# Patient Record
Sex: Female | Born: 1935
Health system: Southern US, Community
[De-identification: ages and names within clinical notes are randomized; demographics above are authoritative.]

## PROBLEM LIST (undated history)

## (undated) DIAGNOSIS — H9190 Unspecified hearing loss, unspecified ear: Secondary | ICD-10-CM

## (undated) DIAGNOSIS — C50919 Malignant neoplasm of unspecified site of unspecified female breast: Secondary | ICD-10-CM

## (undated) DIAGNOSIS — F32A Depression, unspecified: Secondary | ICD-10-CM

## (undated) DIAGNOSIS — E785 Hyperlipidemia, unspecified: Secondary | ICD-10-CM

## (undated) DIAGNOSIS — F419 Anxiety disorder, unspecified: Secondary | ICD-10-CM

## (undated) DIAGNOSIS — Z9221 Personal history of antineoplastic chemotherapy: Secondary | ICD-10-CM

## (undated) DIAGNOSIS — M1990 Unspecified osteoarthritis, unspecified site: Secondary | ICD-10-CM

## (undated) DIAGNOSIS — F329 Major depressive disorder, single episode, unspecified: Secondary | ICD-10-CM

## (undated) DIAGNOSIS — Z923 Personal history of irradiation: Secondary | ICD-10-CM

## (undated) DIAGNOSIS — E119 Type 2 diabetes mellitus without complications: Secondary | ICD-10-CM

## (undated) DIAGNOSIS — G510 Bell's palsy: Secondary | ICD-10-CM

## (undated) DIAGNOSIS — I10 Essential (primary) hypertension: Secondary | ICD-10-CM

## (undated) DIAGNOSIS — K219 Gastro-esophageal reflux disease without esophagitis: Secondary | ICD-10-CM

## (undated) DIAGNOSIS — Z972 Presence of dental prosthetic device (complete) (partial): Secondary | ICD-10-CM

## (undated) HISTORY — DX: Essential (primary) hypertension: I10

## (undated) HISTORY — DX: Unspecified hearing loss, unspecified ear: H91.90

## (undated) HISTORY — DX: Hyperlipidemia, unspecified: E78.5

## (undated) HISTORY — DX: Bell's palsy: G51.0

## (undated) HISTORY — DX: Unspecified osteoarthritis, unspecified site: M19.90

## (undated) HISTORY — PX: JOINT REPLACEMENT: SHX530

---

## 2005-05-18 ENCOUNTER — Ambulatory Visit: Payer: Self-pay | Admitting: Internal Medicine

## 2005-05-20 ENCOUNTER — Ambulatory Visit: Payer: Self-pay | Admitting: Internal Medicine

## 2006-06-08 ENCOUNTER — Ambulatory Visit: Payer: Self-pay | Admitting: Internal Medicine

## 2006-09-01 ENCOUNTER — Ambulatory Visit: Payer: Self-pay | Admitting: Gastroenterology

## 2006-12-11 ENCOUNTER — Ambulatory Visit: Payer: Self-pay | Admitting: Internal Medicine

## 2007-12-12 ENCOUNTER — Ambulatory Visit: Payer: Self-pay | Admitting: Family Medicine

## 2008-12-25 ENCOUNTER — Ambulatory Visit: Payer: Self-pay | Admitting: Internal Medicine

## 2009-10-05 ENCOUNTER — Ambulatory Visit: Payer: Self-pay | Admitting: Orthopedic Surgery

## 2009-10-14 HISTORY — PX: REPLACEMENT TOTAL KNEE: SUR1224

## 2009-10-19 ENCOUNTER — Inpatient Hospital Stay: Payer: Self-pay | Admitting: Orthopedic Surgery

## 2010-01-08 ENCOUNTER — Ambulatory Visit: Payer: Self-pay | Admitting: Internal Medicine

## 2010-11-14 DIAGNOSIS — C50919 Malignant neoplasm of unspecified site of unspecified female breast: Secondary | ICD-10-CM

## 2010-11-14 HISTORY — PX: BREAST LUMPECTOMY: SHX2

## 2010-11-14 HISTORY — PX: BREAST EXCISIONAL BIOPSY: SUR124

## 2010-11-14 HISTORY — DX: Malignant neoplasm of unspecified site of unspecified female breast: C50.919

## 2011-01-11 ENCOUNTER — Ambulatory Visit: Payer: Self-pay | Admitting: Internal Medicine

## 2011-01-14 ENCOUNTER — Ambulatory Visit: Payer: Self-pay | Admitting: Internal Medicine

## 2011-02-08 ENCOUNTER — Ambulatory Visit: Payer: Self-pay | Admitting: Surgery

## 2011-02-13 ENCOUNTER — Ambulatory Visit: Payer: Self-pay | Admitting: Internal Medicine

## 2011-02-13 DIAGNOSIS — Z853 Personal history of malignant neoplasm of breast: Secondary | ICD-10-CM | POA: Insufficient documentation

## 2011-02-15 LAB — PATHOLOGY REPORT

## 2011-02-17 ENCOUNTER — Ambulatory Visit: Payer: Self-pay | Admitting: Surgery

## 2011-02-22 ENCOUNTER — Ambulatory Visit: Payer: Self-pay | Admitting: Surgery

## 2011-02-23 ENCOUNTER — Inpatient Hospital Stay: Payer: Self-pay | Admitting: Surgery

## 2011-02-25 LAB — PATHOLOGY REPORT

## 2011-03-01 ENCOUNTER — Ambulatory Visit: Payer: Self-pay | Admitting: Internal Medicine

## 2011-03-15 ENCOUNTER — Ambulatory Visit: Payer: Self-pay | Admitting: Internal Medicine

## 2011-03-28 ENCOUNTER — Ambulatory Visit: Payer: Self-pay | Admitting: Surgery

## 2011-04-15 ENCOUNTER — Ambulatory Visit: Payer: Self-pay | Admitting: Internal Medicine

## 2011-05-15 ENCOUNTER — Ambulatory Visit: Payer: Self-pay | Admitting: Internal Medicine

## 2011-06-15 ENCOUNTER — Ambulatory Visit: Payer: Self-pay | Admitting: Internal Medicine

## 2011-07-16 ENCOUNTER — Ambulatory Visit: Payer: Self-pay | Admitting: Internal Medicine

## 2011-08-15 ENCOUNTER — Ambulatory Visit: Payer: Self-pay | Admitting: Internal Medicine

## 2011-09-15 ENCOUNTER — Ambulatory Visit: Payer: Self-pay | Admitting: Internal Medicine

## 2011-10-15 ENCOUNTER — Ambulatory Visit: Payer: Self-pay | Admitting: Internal Medicine

## 2011-11-15 ENCOUNTER — Ambulatory Visit: Payer: Self-pay | Admitting: Internal Medicine

## 2011-11-15 HISTORY — PX: BREAST BIOPSY: SHX20

## 2011-11-17 LAB — HEPATIC FUNCTION PANEL A (ARMC)
Albumin: 3.5 g/dL (ref 3.4–5.0)
Bilirubin,Total: 0.3 mg/dL (ref 0.2–1.0)
SGOT(AST): 14 U/L — ABNORMAL LOW (ref 15–37)
SGPT (ALT): 15 U/L
Total Protein: 7.3 g/dL (ref 6.4–8.2)

## 2011-11-17 LAB — CBC CANCER CENTER
Basophil #: 0 x10 3/mm (ref 0.0–0.1)
Eosinophil %: 0.6 %
Lymphocyte #: 1.9 x10 3/mm (ref 1.0–3.6)
Lymphocyte %: 23.2 %
MCV: 91.4 fL (ref 80–100)
Monocyte #: 0.6 x10 3/mm (ref 0.0–0.7)
Monocyte %: 7.2 %
Neutrophil %: 68.7 %
Platelet: 252 x10 3/mm (ref 150–440)
RBC: 3.99 10*6/uL (ref 3.80–5.20)
RDW: 13 % (ref 11.5–14.5)

## 2011-11-17 LAB — BASIC METABOLIC PANEL
Anion Gap: 8 (ref 7–16)
BUN: 15 mg/dL (ref 7–18)
Co2: 30 mmol/L (ref 21–32)
EGFR (African American): >60
EGFR (Non-African Amer.): 58 — ABNORMAL LOW
Glucose: 99 mg/dL (ref 65–99)
Osmolality: 271 (ref 275–301)
Potassium: 3.8 mmol/L (ref 3.5–5.1)

## 2011-11-17 LAB — MAGNESIUM: Magnesium: 1.6 mg/dL — ABNORMAL LOW

## 2011-12-16 ENCOUNTER — Ambulatory Visit: Payer: Self-pay | Admitting: Internal Medicine

## 2012-01-13 ENCOUNTER — Ambulatory Visit: Payer: Self-pay | Admitting: Internal Medicine

## 2012-01-19 LAB — BASIC METABOLIC PANEL
Anion Gap: 6 — ABNORMAL LOW (ref 7–16)
BUN: 13 mg/dL (ref 7–18)
Calcium, Total: 9.2 mg/dL (ref 8.5–10.1)
Co2: 31 mmol/L (ref 21–32)
Creatinine: 1.03 mg/dL (ref 0.60–1.30)
EGFR (African American): >60
EGFR (Non-African Amer.): 56 — ABNORMAL LOW
Osmolality: 268 (ref 275–301)
Sodium: 133 mmol/L — ABNORMAL LOW (ref 136–145)

## 2012-01-19 LAB — CBC CANCER CENTER
Basophil %: 0.5 %
Eosinophil #: 0.1 x10 3/mm (ref 0.0–0.7)
Eosinophil %: 0.7 %
Lymphocyte #: 2 x10 3/mm (ref 1.0–3.6)
Lymphocyte %: 26.6 %
MCH: 30.9 pg (ref 26.0–34.0)
MCHC: 34.5 g/dL (ref 32.0–36.0)
MCV: 89.5 fL (ref 80–100)
Monocyte #: 0.6 x10 3/mm (ref 0.0–0.7)
Neutrophil %: 63.9 %
Platelet: 255 x10 3/mm (ref 150–440)
RBC: 3.67 10*6/uL — ABNORMAL LOW (ref 3.80–5.20)
RDW: 14.5 % (ref 11.5–14.5)
WBC: 7.6 x10 3/mm (ref 3.6–11.0)

## 2012-01-19 LAB — HEPATIC FUNCTION PANEL A (ARMC)
Albumin: 3.3 g/dL — ABNORMAL LOW (ref 3.4–5.0)
Alkaline Phosphatase: 134 U/L (ref 50–136)
Bilirubin, Direct: 0.1 mg/dL (ref 0.00–0.20)
SGOT(AST): 13 U/L — ABNORMAL LOW (ref 15–37)
SGPT (ALT): 16 U/L

## 2012-02-13 ENCOUNTER — Ambulatory Visit: Payer: Self-pay | Admitting: Surgery

## 2012-02-13 ENCOUNTER — Ambulatory Visit: Payer: Self-pay | Admitting: Internal Medicine

## 2012-03-14 ENCOUNTER — Ambulatory Visit: Payer: Self-pay | Admitting: Internal Medicine

## 2012-03-22 LAB — CBC CANCER CENTER
Basophil #: 0.1 x10 3/mm (ref 0.0–0.1)
Basophil %: 0.7 %
Eosinophil #: 0.1 x10 3/mm (ref 0.0–0.7)
Eosinophil %: 1.1 %
HCT: 36.6 % (ref 35.0–47.0)
HGB: 12.2 g/dL (ref 12.0–16.0)
Lymphocyte #: 2.6 x10 3/mm (ref 1.0–3.6)
Lymphocyte %: 30.5 %
MCH: 29.8 pg (ref 26.0–34.0)
MCHC: 33.4 g/dL (ref 32.0–36.0)
MCV: 89 fL (ref 80–100)
Monocyte #: 0.6 x10 3/mm (ref 0.2–0.9)
Monocyte %: 7.2 %
Neutrophil #: 5.1 x10 3/mm (ref 1.4–6.5)
Neutrophil %: 60.5 %
Platelet: 250 x10 3/mm (ref 150–440)
RBC: 4.11 10*6/uL (ref 3.80–5.20)
RDW: 14.9 % — ABNORMAL HIGH (ref 11.5–14.5)
WBC: 8.5 x10 3/mm (ref 3.6–11.0)

## 2012-03-22 LAB — BASIC METABOLIC PANEL
Anion Gap: 9 (ref 7–16)
BUN: 11 mg/dL (ref 7–18)
Calcium, Total: 9 mg/dL (ref 8.5–10.1)
Chloride: 97 mmol/L — ABNORMAL LOW (ref 98–107)
Co2: 29 mmol/L (ref 21–32)
Creatinine: 0.92 mg/dL (ref 0.60–1.30)
EGFR (Non-African Amer.): >60
Glucose: 97 mg/dL (ref 65–99)
Osmolality: 269 (ref 275–301)
Potassium: 3.7 mmol/L (ref 3.5–5.1)

## 2012-04-14 ENCOUNTER — Ambulatory Visit: Payer: Self-pay | Admitting: Internal Medicine

## 2012-05-14 ENCOUNTER — Ambulatory Visit: Payer: Self-pay | Admitting: Internal Medicine

## 2012-06-14 ENCOUNTER — Ambulatory Visit: Payer: Self-pay | Admitting: Internal Medicine

## 2012-07-10 ENCOUNTER — Emergency Department: Payer: Self-pay | Admitting: Emergency Medicine

## 2012-07-10 LAB — WET PREP, GENITAL

## 2012-07-15 ENCOUNTER — Ambulatory Visit: Payer: Self-pay | Admitting: Internal Medicine

## 2012-07-26 LAB — HEPATIC FUNCTION PANEL A (ARMC)
Albumin: 3.4 g/dL (ref 3.4–5.0)
Alkaline Phosphatase: 145 U/L — ABNORMAL HIGH (ref 50–136)
Bilirubin, Direct: 0.1 mg/dL (ref 0.00–0.20)
Bilirubin,Total: 0.3 mg/dL (ref 0.2–1.0)
SGOT(AST): 16 U/L (ref 15–37)

## 2012-07-26 LAB — CBC CANCER CENTER
Eosinophil #: 0.1 x10 3/mm (ref 0.0–0.7)
Eosinophil %: 1.5 %
HGB: 12.8 g/dL (ref 12.0–16.0)
MCH: 30 pg (ref 26.0–34.0)
MCHC: 33.9 g/dL (ref 32.0–36.0)
Monocyte #: 0.8 x10 3/mm (ref 0.2–0.9)
Monocyte %: 8.2 %
Neutrophil %: 62.9 %
Platelet: 267 x10 3/mm (ref 150–440)
RBC: 4.25 10*6/uL (ref 3.80–5.20)

## 2012-07-26 LAB — CREATININE, SERUM
Creatinine: 0.98 mg/dL (ref 0.60–1.30)
EGFR (African American): >60
EGFR (Non-African Amer.): 57 — ABNORMAL LOW

## 2012-08-14 ENCOUNTER — Ambulatory Visit: Payer: Self-pay

## 2012-08-14 ENCOUNTER — Ambulatory Visit: Payer: Self-pay | Admitting: Internal Medicine

## 2012-08-23 ENCOUNTER — Ambulatory Visit: Payer: Self-pay | Admitting: Surgery

## 2012-09-14 ENCOUNTER — Ambulatory Visit: Payer: Self-pay | Admitting: Internal Medicine

## 2012-11-14 HISTORY — PX: BREAST BIOPSY: SHX20

## 2012-11-29 ENCOUNTER — Ambulatory Visit: Payer: Self-pay | Admitting: Internal Medicine

## 2012-11-29 LAB — HEPATIC FUNCTION PANEL A (ARMC)
Alkaline Phosphatase: 136 U/L (ref 50–136)
Bilirubin, Direct: 0.1 mg/dL (ref 0.00–0.20)
Bilirubin,Total: 0.3 mg/dL (ref 0.2–1.0)
SGOT(AST): 21 U/L (ref 15–37)
Total Protein: 7.3 g/dL (ref 6.4–8.2)

## 2012-11-29 LAB — CBC CANCER CENTER
Basophil #: 0.1 x10 3/mm (ref 0.0–0.1)
Basophil %: 1 %
Eosinophil #: 0.1 x10 3/mm (ref 0.0–0.7)
HCT: 39 % (ref 35.0–47.0)
HGB: 13 g/dL (ref 12.0–16.0)
Lymphocyte #: 2.4 x10 3/mm (ref 1.0–3.6)
MCH: 29.3 pg (ref 26.0–34.0)
MCV: 88 fL (ref 80–100)
Neutrophil #: 4.7 x10 3/mm (ref 1.4–6.5)
Neutrophil %: 59.8 %
WBC: 7.9 x10 3/mm (ref 3.6–11.0)

## 2012-11-29 LAB — CREATININE, SERUM
Creatinine: 0.98 mg/dL (ref 0.60–1.30)
EGFR (African American): >60
EGFR (Non-African Amer.): 57 — ABNORMAL LOW

## 2012-11-30 LAB — CANCER ANTIGEN 27.29: CA 27.29: 24.2 U/mL (ref 0.0–38.6)

## 2012-12-15 ENCOUNTER — Ambulatory Visit: Payer: Self-pay | Admitting: Internal Medicine

## 2013-01-12 ENCOUNTER — Ambulatory Visit: Payer: Self-pay | Admitting: Internal Medicine

## 2013-01-22 ENCOUNTER — Ambulatory Visit: Payer: Self-pay | Admitting: Surgery

## 2013-01-29 ENCOUNTER — Ambulatory Visit: Payer: Self-pay | Admitting: Surgery

## 2013-01-30 ENCOUNTER — Ambulatory Visit: Payer: Self-pay | Admitting: Surgery

## 2013-02-21 ENCOUNTER — Ambulatory Visit: Payer: Self-pay | Admitting: Internal Medicine

## 2013-02-25 ENCOUNTER — Ambulatory Visit: Payer: Self-pay | Admitting: Surgery

## 2013-02-26 LAB — PATHOLOGY REPORT

## 2013-03-14 ENCOUNTER — Ambulatory Visit: Payer: Self-pay | Admitting: Internal Medicine

## 2013-04-14 ENCOUNTER — Ambulatory Visit: Payer: Self-pay | Admitting: Internal Medicine

## 2013-05-16 ENCOUNTER — Ambulatory Visit: Payer: Self-pay | Admitting: Internal Medicine

## 2013-06-14 ENCOUNTER — Ambulatory Visit: Payer: Self-pay | Admitting: Internal Medicine

## 2013-06-27 LAB — HEPATIC FUNCTION PANEL A (ARMC)
Alkaline Phosphatase: 122 U/L (ref 50–136)
Bilirubin, Direct: <0.05 mg/dL (ref 0.00–0.20)
Bilirubin,Total: 0.3 mg/dL (ref 0.2–1.0)
SGOT(AST): 17 U/L (ref 15–37)
SGPT (ALT): 18 U/L (ref 12–78)

## 2013-06-27 LAB — CBC CANCER CENTER
Basophil #: 0 x10 3/mm (ref 0.0–0.1)
Basophil %: 0.4 %
Eosinophil #: 0.1 x10 3/mm (ref 0.0–0.7)
Eosinophil %: 1.3 %
HCT: 37.3 % (ref 35.0–47.0)
HGB: 12.6 g/dL (ref 12.0–16.0)
Lymphocyte #: 1.8 x10 3/mm (ref 1.0–3.6)
Lymphocyte %: 24.2 %
MCH: 29.1 pg (ref 26.0–34.0)
MCHC: 33.7 g/dL (ref 32.0–36.0)
Monocyte %: 8.4 %
Neutrophil #: 5 x10 3/mm (ref 1.4–6.5)
Platelet: 257 x10 3/mm (ref 150–440)
RBC: 4.33 10*6/uL (ref 3.80–5.20)
RDW: 14.3 % (ref 11.5–14.5)

## 2013-06-27 LAB — CREATININE, SERUM
Creatinine: 1.06 mg/dL (ref 0.60–1.30)
EGFR (African American): 59 — ABNORMAL LOW
EGFR (Non-African Amer.): 51 — ABNORMAL LOW

## 2013-06-28 LAB — CANCER ANTIGEN 27.29: CA 27.29: 20.7 U/mL (ref 0.0–38.6)

## 2013-07-15 ENCOUNTER — Ambulatory Visit: Payer: Self-pay | Admitting: Internal Medicine

## 2013-08-29 ENCOUNTER — Ambulatory Visit: Payer: Self-pay | Admitting: Surgery

## 2014-02-22 ENCOUNTER — Emergency Department: Payer: Self-pay | Admitting: Emergency Medicine

## 2014-02-27 ENCOUNTER — Ambulatory Visit: Payer: Self-pay | Admitting: Internal Medicine

## 2014-03-13 LAB — HEPATIC FUNCTION PANEL A (ARMC)
Albumin: 3.5 g/dL (ref 3.4–5.0)
Alkaline Phosphatase: 132 U/L — ABNORMAL HIGH
Bilirubin, Direct: 0.1 mg/dL (ref 0.00–0.20)
Bilirubin,Total: 0.4 mg/dL (ref 0.2–1.0)
SGOT(AST): 18 U/L (ref 15–37)
SGPT (ALT): 18 U/L (ref 12–78)
Total Protein: 8 g/dL (ref 6.4–8.2)

## 2014-03-13 LAB — CBC CANCER CENTER
BASOS ABS: 0.2 x10 3/mm — AB (ref 0.0–0.1)
Basophil %: 1.5 %
EOS PCT: 1 %
Eosinophil #: 0.1 x10 3/mm (ref 0.0–0.7)
HCT: 41.5 % (ref 35.0–47.0)
HGB: 13.4 g/dL (ref 12.0–16.0)
LYMPHS ABS: 3.3 x10 3/mm (ref 1.0–3.6)
LYMPHS PCT: 27.4 %
MCH: 28.3 pg (ref 26.0–34.0)
MCHC: 32.4 g/dL (ref 32.0–36.0)
MCV: 87 fL (ref 80–100)
MONO ABS: 0.8 x10 3/mm (ref 0.2–0.9)
Monocyte %: 6.7 %
Neutrophil #: 7.8 x10 3/mm — ABNORMAL HIGH (ref 1.4–6.5)
Neutrophil %: 63.4 %
PLATELETS: 310 x10 3/mm (ref 150–440)
RBC: 4.75 10*6/uL (ref 3.80–5.20)
RDW: 14.8 % — AB (ref 11.5–14.5)
WBC: 12.2 x10 3/mm — ABNORMAL HIGH (ref 3.6–11.0)

## 2014-03-13 LAB — CREATININE, SERUM
Creatinine: 0.94 mg/dL (ref 0.60–1.30)
EGFR (African American): >60
EGFR (Non-African Amer.): 59 — ABNORMAL LOW

## 2014-03-14 ENCOUNTER — Ambulatory Visit: Payer: Self-pay | Admitting: Internal Medicine

## 2014-03-26 DIAGNOSIS — S0990XS Unspecified injury of head, sequela: Secondary | ICD-10-CM

## 2014-03-26 DIAGNOSIS — E1169 Type 2 diabetes mellitus with other specified complication: Secondary | ICD-10-CM | POA: Insufficient documentation

## 2014-03-26 DIAGNOSIS — M8580 Other specified disorders of bone density and structure, unspecified site: Secondary | ICD-10-CM | POA: Insufficient documentation

## 2014-03-26 DIAGNOSIS — E559 Vitamin D deficiency, unspecified: Secondary | ICD-10-CM | POA: Insufficient documentation

## 2014-03-26 DIAGNOSIS — H9191 Unspecified hearing loss, right ear: Secondary | ICD-10-CM | POA: Insufficient documentation

## 2014-03-26 DIAGNOSIS — E785 Hyperlipidemia, unspecified: Secondary | ICD-10-CM | POA: Insufficient documentation

## 2014-03-28 LAB — CBC CANCER CENTER
Basophil #: 0.1 "x10 3/mm "
Basophil %: 1.4 %
Eosinophil #: 0.2 "x10 3/mm "
Eosinophil %: 2.5 %
HCT: 41.1 %
HGB: 13.4 g/dL
Lymphocyte %: 32.3 %
Lymphs Abs: 2.8 "x10 3/mm "
MCH: 28.4 pg
MCHC: 32.8 g/dL
MCV: 87 fL
Monocyte #: 0.7 "x10 3/mm "
Monocyte %: 8.1 %
Neutrophil #: 4.8 "x10 3/mm "
Neutrophil %: 55.7 %
Platelet: 272 "x10 3/mm "
RBC: 4.73 "x10 6/mm "
RDW: 15 % — ABNORMAL HIGH
WBC: 8.5 "x10 3/mm "

## 2014-03-28 LAB — ALKALINE PHOSPHATASE: Alkaline Phosphatase: 123 U/L — ABNORMAL HIGH

## 2014-04-14 ENCOUNTER — Ambulatory Visit: Payer: Self-pay | Admitting: Internal Medicine

## 2014-07-03 DIAGNOSIS — H9191 Unspecified hearing loss, right ear: Secondary | ICD-10-CM | POA: Insufficient documentation

## 2014-07-03 DIAGNOSIS — G510 Bell's palsy: Secondary | ICD-10-CM | POA: Insufficient documentation

## 2014-07-05 DIAGNOSIS — I10 Essential (primary) hypertension: Secondary | ICD-10-CM | POA: Insufficient documentation

## 2014-07-05 DIAGNOSIS — E1122 Type 2 diabetes mellitus with diabetic chronic kidney disease: Secondary | ICD-10-CM | POA: Insufficient documentation

## 2014-07-05 DIAGNOSIS — E1159 Type 2 diabetes mellitus with other circulatory complications: Secondary | ICD-10-CM | POA: Insufficient documentation

## 2014-07-05 DIAGNOSIS — E118 Type 2 diabetes mellitus with unspecified complications: Secondary | ICD-10-CM | POA: Insufficient documentation

## 2014-10-22 DIAGNOSIS — Z23 Encounter for immunization: Secondary | ICD-10-CM | POA: Insufficient documentation

## 2015-02-10 DIAGNOSIS — L280 Lichen simplex chronicus: Secondary | ICD-10-CM | POA: Insufficient documentation

## 2015-04-13 ENCOUNTER — Other Ambulatory Visit: Payer: Self-pay | Admitting: *Deleted

## 2015-04-13 DIAGNOSIS — C50912 Malignant neoplasm of unspecified site of left female breast: Secondary | ICD-10-CM

## 2015-04-16 ENCOUNTER — Inpatient Hospital Stay: Payer: Medicare Other | Attending: Internal Medicine

## 2015-04-16 ENCOUNTER — Inpatient Hospital Stay (HOSPITAL_BASED_OUTPATIENT_CLINIC_OR_DEPARTMENT_OTHER): Payer: Medicare Other | Admitting: Internal Medicine

## 2015-04-16 ENCOUNTER — Other Ambulatory Visit: Payer: Self-pay

## 2015-04-16 VITALS — BP 165/76 | HR 71 | Temp 96.0°F | Ht 62.0 in | Wt 209.2 lb

## 2015-04-16 DIAGNOSIS — Z923 Personal history of irradiation: Secondary | ICD-10-CM | POA: Diagnosis not present

## 2015-04-16 DIAGNOSIS — Z803 Family history of malignant neoplasm of breast: Secondary | ICD-10-CM

## 2015-04-16 DIAGNOSIS — Z79899 Other long term (current) drug therapy: Secondary | ICD-10-CM | POA: Diagnosis not present

## 2015-04-16 DIAGNOSIS — Z853 Personal history of malignant neoplasm of breast: Secondary | ICD-10-CM | POA: Diagnosis not present

## 2015-04-16 DIAGNOSIS — C50912 Malignant neoplasm of unspecified site of left female breast: Secondary | ICD-10-CM

## 2015-04-16 DIAGNOSIS — D509 Iron deficiency anemia, unspecified: Secondary | ICD-10-CM | POA: Diagnosis not present

## 2015-04-16 DIAGNOSIS — I10 Essential (primary) hypertension: Secondary | ICD-10-CM | POA: Insufficient documentation

## 2015-04-16 DIAGNOSIS — M1990 Unspecified osteoarthritis, unspecified site: Secondary | ICD-10-CM | POA: Insufficient documentation

## 2015-04-16 DIAGNOSIS — Z9221 Personal history of antineoplastic chemotherapy: Secondary | ICD-10-CM

## 2015-04-16 DIAGNOSIS — Z7982 Long term (current) use of aspirin: Secondary | ICD-10-CM | POA: Diagnosis not present

## 2015-04-16 DIAGNOSIS — E785 Hyperlipidemia, unspecified: Secondary | ICD-10-CM | POA: Diagnosis not present

## 2015-04-16 LAB — CBC WITH DIFFERENTIAL/PLATELET
Basophils Absolute: 0 10*3/uL (ref 0–0.1)
Basophils Relative: 0 %
EOS ABS: 0.1 10*3/uL (ref 0–0.7)
Eosinophils Relative: 1 %
HCT: 39.7 % (ref 35.0–47.0)
Hemoglobin: 13.3 g/dL (ref 12.0–16.0)
LYMPHS PCT: 35 %
Lymphs Abs: 3.6 10*3/uL (ref 1.0–3.6)
MCH: 28.3 pg (ref 26.0–34.0)
MCHC: 33.5 g/dL (ref 32.0–36.0)
MCV: 84.6 fL (ref 80.0–100.0)
MONO ABS: 0.7 10*3/uL (ref 0.2–0.9)
Monocytes Relative: 6 %
NEUTROS PCT: 58 %
Neutro Abs: 6 10*3/uL (ref 1.4–6.5)
PLATELETS: 277 10*3/uL (ref 150–440)
RBC: 4.7 MIL/uL (ref 3.80–5.20)
RDW: 15.1 % — ABNORMAL HIGH (ref 11.5–14.5)
WBC: 10.4 10*3/uL (ref 3.6–11.0)

## 2015-04-16 LAB — HEPATIC FUNCTION PANEL
ALK PHOS: 103 U/L (ref 38–126)
ALT: 13 U/L — ABNORMAL LOW (ref 14–54)
AST: 19 U/L (ref 15–41)
Albumin: 3.8 g/dL (ref 3.5–5.0)
TOTAL PROTEIN: 7.6 g/dL (ref 6.5–8.1)
Total Bilirubin: 0.5 mg/dL (ref 0.3–1.2)

## 2015-04-16 LAB — CREATININE, SERUM
CREATININE: 0.91 mg/dL (ref 0.44–1.00)
GFR calc non Af Amer: 59 mL/min — ABNORMAL LOW (ref >60)

## 2015-04-17 ENCOUNTER — Other Ambulatory Visit: Payer: Self-pay

## 2015-04-17 ENCOUNTER — Ambulatory Visit: Payer: Self-pay | Admitting: Internal Medicine

## 2015-04-17 LAB — CANCER ANTIGEN 27.29: CA 27.29: 34.2 U/mL (ref 0.0–38.6)

## 2015-04-22 ENCOUNTER — Ambulatory Visit: Payer: Medicare Other

## 2015-04-22 ENCOUNTER — Ambulatory Visit
Admission: RE | Admit: 2015-04-22 | Discharge: 2015-04-22 | Disposition: A | Discharge status: Home / Self Care | Payer: Medicare Other | Source: Ambulatory Visit | Attending: Internal Medicine | Admitting: Internal Medicine

## 2015-04-22 ENCOUNTER — Other Ambulatory Visit: Payer: Self-pay | Admitting: Internal Medicine

## 2015-04-22 DIAGNOSIS — Z853 Personal history of malignant neoplasm of breast: Secondary | ICD-10-CM | POA: Insufficient documentation

## 2015-04-22 DIAGNOSIS — C50912 Malignant neoplasm of unspecified site of left female breast: Secondary | ICD-10-CM

## 2015-04-22 DIAGNOSIS — Z9889 Other specified postprocedural states: Secondary | ICD-10-CM | POA: Diagnosis not present

## 2015-04-22 HISTORY — DX: Malignant neoplasm of unspecified site of unspecified female breast: C50.919

## 2015-05-03 DIAGNOSIS — C50912 Malignant neoplasm of unspecified site of left female breast: Secondary | ICD-10-CM | POA: Insufficient documentation
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
## 2015-07-06 DIAGNOSIS — Z79899 Other long term (current) drug therapy: Secondary | ICD-10-CM | POA: Insufficient documentation

## 2015-11-25 DIAGNOSIS — J069 Acute upper respiratory infection, unspecified: Secondary | ICD-10-CM | POA: Diagnosis not present

## 2016-01-07 DIAGNOSIS — IMO0002 Reserved for concepts with insufficient information to code with codable children: Secondary | ICD-10-CM | POA: Insufficient documentation

## 2016-01-07 DIAGNOSIS — E559 Vitamin D deficiency, unspecified: Secondary | ICD-10-CM | POA: Diagnosis not present

## 2016-01-07 DIAGNOSIS — I10 Essential (primary) hypertension: Secondary | ICD-10-CM | POA: Diagnosis not present

## 2016-01-07 DIAGNOSIS — E119 Type 2 diabetes mellitus without complications: Secondary | ICD-10-CM | POA: Diagnosis not present

## 2016-01-07 DIAGNOSIS — E782 Mixed hyperlipidemia: Secondary | ICD-10-CM | POA: Diagnosis not present

## 2016-01-07 DIAGNOSIS — M8580 Other specified disorders of bone density and structure, unspecified site: Secondary | ICD-10-CM | POA: Diagnosis not present

## 2016-01-15 DIAGNOSIS — M8580 Other specified disorders of bone density and structure, unspecified site: Secondary | ICD-10-CM | POA: Diagnosis not present

## 2016-01-15 DIAGNOSIS — E559 Vitamin D deficiency, unspecified: Secondary | ICD-10-CM | POA: Diagnosis not present

## 2016-04-14 ENCOUNTER — Ambulatory Visit: Payer: Medicare Other | Admitting: Family Medicine

## 2016-04-14 ENCOUNTER — Other Ambulatory Visit: Payer: Medicare Other

## 2016-04-26 ENCOUNTER — Inpatient Hospital Stay: Payer: PPO | Attending: Internal Medicine

## 2016-04-26 ENCOUNTER — Inpatient Hospital Stay (HOSPITAL_BASED_OUTPATIENT_CLINIC_OR_DEPARTMENT_OTHER): Payer: PPO | Admitting: Internal Medicine

## 2016-04-26 ENCOUNTER — Encounter: Payer: Self-pay | Admitting: Internal Medicine

## 2016-04-26 VITALS — BP 148/78 | HR 64 | Temp 97.1°F | Resp 18 | Wt 201.4 lb

## 2016-04-26 DIAGNOSIS — I10 Essential (primary) hypertension: Secondary | ICD-10-CM | POA: Insufficient documentation

## 2016-04-26 DIAGNOSIS — Z923 Personal history of irradiation: Secondary | ICD-10-CM | POA: Diagnosis not present

## 2016-04-26 DIAGNOSIS — Z9221 Personal history of antineoplastic chemotherapy: Secondary | ICD-10-CM | POA: Diagnosis not present

## 2016-04-26 DIAGNOSIS — Z853 Personal history of malignant neoplasm of breast: Secondary | ICD-10-CM | POA: Diagnosis not present

## 2016-04-26 DIAGNOSIS — Z7982 Long term (current) use of aspirin: Secondary | ICD-10-CM | POA: Insufficient documentation

## 2016-04-26 DIAGNOSIS — Z79899 Other long term (current) drug therapy: Secondary | ICD-10-CM | POA: Diagnosis not present

## 2016-04-26 DIAGNOSIS — C50912 Malignant neoplasm of unspecified site of left female breast: Secondary | ICD-10-CM

## 2016-04-26 DIAGNOSIS — E785 Hyperlipidemia, unspecified: Secondary | ICD-10-CM | POA: Diagnosis not present

## 2016-04-26 DIAGNOSIS — M1990 Unspecified osteoarthritis, unspecified site: Secondary | ICD-10-CM | POA: Insufficient documentation

## 2016-04-26 DIAGNOSIS — Z171 Estrogen receptor negative status [ER-]: Secondary | ICD-10-CM | POA: Insufficient documentation

## 2016-04-26 LAB — CBC WITH DIFFERENTIAL/PLATELET
BASOS ABS: 0.1 10*3/uL (ref 0–0.1)
Eosinophils Absolute: 0.1 10*3/uL (ref 0–0.7)
Eosinophils Relative: 1 %
HEMATOCRIT: 40.1 % (ref 35.0–47.0)
HEMOGLOBIN: 13.4 g/dL (ref 12.0–16.0)
Lymphs Abs: 3.4 10*3/uL (ref 1.0–3.6)
MCH: 28.2 pg (ref 26.0–34.0)
MCHC: 33.5 g/dL (ref 32.0–36.0)
MCV: 84.3 fL (ref 80.0–100.0)
Monocytes Absolute: 0.8 10*3/uL (ref 0.2–0.9)
Monocytes Relative: 7 %
NEUTROS ABS: 6.3 10*3/uL (ref 1.4–6.5)
Platelets: 311 10*3/uL (ref 150–440)
RBC: 4.76 MIL/uL (ref 3.80–5.20)
RDW: 14.4 % (ref 11.5–14.5)
WBC: 10.7 10*3/uL (ref 3.6–11.0)

## 2016-04-26 LAB — COMPREHENSIVE METABOLIC PANEL
ALBUMIN: 3.9 g/dL (ref 3.5–5.0)
ALK PHOS: 115 U/L (ref 38–126)
ALT: 13 U/L — AB (ref 14–54)
AST: 18 U/L (ref 15–41)
Anion gap: 8 (ref 5–15)
BILIRUBIN TOTAL: 0.4 mg/dL (ref 0.3–1.2)
BUN: 12 mg/dL (ref 6–20)
CALCIUM: 9.6 mg/dL (ref 8.9–10.3)
CO2: 28 mmol/L (ref 22–32)
CREATININE: 1 mg/dL (ref 0.44–1.00)
Chloride: 95 mmol/L — ABNORMAL LOW (ref 101–111)
GFR calc Af Amer: >60 mL/min (ref >60)
GFR calc non Af Amer: 53 mL/min — ABNORMAL LOW (ref >60)
GLUCOSE: 101 mg/dL — AB (ref 65–99)
Potassium: 3.7 mmol/L (ref 3.5–5.1)
Sodium: 131 mmol/L — ABNORMAL LOW (ref 135–145)
TOTAL PROTEIN: 7.7 g/dL (ref 6.5–8.1)
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
## 2016-04-27 LAB — CANCER ANTIGEN 27.29: CA 27.29: 27.5 U/mL (ref 0.0–38.6)

## 2016-05-05 DIAGNOSIS — R05 Cough: Secondary | ICD-10-CM | POA: Diagnosis not present

## 2016-05-12 ENCOUNTER — Ambulatory Visit
Admission: RE | Admit: 2016-05-12 | Discharge: 2016-05-12 | Disposition: A | Discharge status: Home / Self Care | Payer: PPO | Source: Ambulatory Visit | Attending: Internal Medicine | Admitting: Internal Medicine

## 2016-05-12 DIAGNOSIS — R928 Other abnormal and inconclusive findings on diagnostic imaging of breast: Secondary | ICD-10-CM | POA: Diagnosis not present

## 2016-05-12 DIAGNOSIS — C50912 Malignant neoplasm of unspecified site of left female breast: Secondary | ICD-10-CM | POA: Insufficient documentation

## 2016-05-12 DIAGNOSIS — N6489 Other specified disorders of breast: Secondary | ICD-10-CM | POA: Diagnosis not present

## 2016-07-05 DIAGNOSIS — G8929 Other chronic pain: Secondary | ICD-10-CM | POA: Diagnosis not present

## 2016-07-05 DIAGNOSIS — E782 Mixed hyperlipidemia: Secondary | ICD-10-CM | POA: Diagnosis not present

## 2016-07-05 DIAGNOSIS — M25561 Pain in right knee: Secondary | ICD-10-CM | POA: Diagnosis not present

## 2016-07-05 DIAGNOSIS — Z79899 Other long term (current) drug therapy: Secondary | ICD-10-CM | POA: Diagnosis not present

## 2016-07-05 DIAGNOSIS — I10 Essential (primary) hypertension: Secondary | ICD-10-CM | POA: Diagnosis not present

## 2016-07-05 DIAGNOSIS — E559 Vitamin D deficiency, unspecified: Secondary | ICD-10-CM | POA: Diagnosis not present

## 2016-07-05 DIAGNOSIS — E119 Type 2 diabetes mellitus without complications: Secondary | ICD-10-CM | POA: Diagnosis not present

## 2016-07-05 DIAGNOSIS — Z23 Encounter for immunization: Secondary | ICD-10-CM | POA: Diagnosis not present

## 2016-07-13 DIAGNOSIS — M25561 Pain in right knee: Secondary | ICD-10-CM | POA: Diagnosis not present

## 2016-07-13 DIAGNOSIS — M1711 Unilateral primary osteoarthritis, right knee: Secondary | ICD-10-CM | POA: Diagnosis not present

## 2016-09-06 DIAGNOSIS — H04123 Dry eye syndrome of bilateral lacrimal glands: Secondary | ICD-10-CM | POA: Diagnosis not present

## 2016-12-22 DIAGNOSIS — L298 Other pruritus: Secondary | ICD-10-CM | POA: Diagnosis not present

## 2017-01-05 DIAGNOSIS — I10 Essential (primary) hypertension: Secondary | ICD-10-CM | POA: Diagnosis not present

## 2017-01-05 DIAGNOSIS — Z79899 Other long term (current) drug therapy: Secondary | ICD-10-CM | POA: Diagnosis not present

## 2017-01-05 DIAGNOSIS — Z1211 Encounter for screening for malignant neoplasm of colon: Secondary | ICD-10-CM | POA: Diagnosis not present

## 2017-01-05 DIAGNOSIS — E782 Mixed hyperlipidemia: Secondary | ICD-10-CM | POA: Diagnosis not present

## 2017-01-05 DIAGNOSIS — E1122 Type 2 diabetes mellitus with diabetic chronic kidney disease: Secondary | ICD-10-CM | POA: Diagnosis not present

## 2017-01-05 DIAGNOSIS — N182 Chronic kidney disease, stage 2 (mild): Secondary | ICD-10-CM | POA: Diagnosis not present

## 2017-01-05 DIAGNOSIS — E559 Vitamin D deficiency, unspecified: Secondary | ICD-10-CM | POA: Diagnosis not present

## 2017-02-04 DIAGNOSIS — J209 Acute bronchitis, unspecified: Secondary | ICD-10-CM | POA: Diagnosis not present

## 2017-02-15 DIAGNOSIS — E1129 Type 2 diabetes mellitus with other diabetic kidney complication: Secondary | ICD-10-CM | POA: Diagnosis not present

## 2017-02-15 DIAGNOSIS — Z1211 Encounter for screening for malignant neoplasm of colon: Secondary | ICD-10-CM | POA: Diagnosis not present

## 2017-06-16 DIAGNOSIS — M542 Cervicalgia: Secondary | ICD-10-CM | POA: Diagnosis not present

## 2017-06-16 DIAGNOSIS — M5412 Radiculopathy, cervical region: Secondary | ICD-10-CM | POA: Diagnosis not present

## 2017-06-16 DIAGNOSIS — M47812 Spondylosis without myelopathy or radiculopathy, cervical region: Secondary | ICD-10-CM | POA: Diagnosis not present

## 2017-06-19 ENCOUNTER — Encounter: Payer: Self-pay | Admitting: *Deleted

## 2017-06-20 ENCOUNTER — Ambulatory Visit: Payer: PPO | Admitting: Anesthesiology

## 2017-06-20 ENCOUNTER — Ambulatory Visit
Admission: RE | Admit: 2017-06-20 | Discharge: 2017-06-20 | Disposition: A | Discharge status: Home / Self Care | Payer: PPO | Source: Ambulatory Visit | Attending: Gastroenterology | Admitting: Gastroenterology

## 2017-06-20 ENCOUNTER — Encounter: Payer: Self-pay | Admitting: *Deleted

## 2017-06-20 ENCOUNTER — Encounter
Admission: RE | Disposition: A | Discharge status: Home / Self Care | Payer: Self-pay | Source: Ambulatory Visit | Attending: Gastroenterology

## 2017-06-20 DIAGNOSIS — Z79899 Other long term (current) drug therapy: Secondary | ICD-10-CM | POA: Insufficient documentation

## 2017-06-20 DIAGNOSIS — I10 Essential (primary) hypertension: Secondary | ICD-10-CM | POA: Insufficient documentation

## 2017-06-20 DIAGNOSIS — Z87891 Personal history of nicotine dependence: Secondary | ICD-10-CM | POA: Insufficient documentation

## 2017-06-20 DIAGNOSIS — M1990 Unspecified osteoarthritis, unspecified site: Secondary | ICD-10-CM | POA: Diagnosis not present

## 2017-06-20 DIAGNOSIS — Z7982 Long term (current) use of aspirin: Secondary | ICD-10-CM | POA: Diagnosis not present

## 2017-06-20 DIAGNOSIS — K5730 Diverticulosis of large intestine without perforation or abscess without bleeding: Secondary | ICD-10-CM | POA: Diagnosis not present

## 2017-06-20 DIAGNOSIS — E785 Hyperlipidemia, unspecified: Secondary | ICD-10-CM | POA: Insufficient documentation

## 2017-06-20 DIAGNOSIS — Z888 Allergy status to other drugs, medicaments and biological substances status: Secondary | ICD-10-CM | POA: Insufficient documentation

## 2017-06-20 DIAGNOSIS — Z9221 Personal history of antineoplastic chemotherapy: Secondary | ICD-10-CM | POA: Insufficient documentation

## 2017-06-20 DIAGNOSIS — K5790 Diverticulosis of intestine, part unspecified, without perforation or abscess without bleeding: Secondary | ICD-10-CM | POA: Diagnosis not present

## 2017-06-20 DIAGNOSIS — Z923 Personal history of irradiation: Secondary | ICD-10-CM | POA: Diagnosis not present

## 2017-06-20 DIAGNOSIS — Z1211 Encounter for screening for malignant neoplasm of colon: Secondary | ICD-10-CM | POA: Insufficient documentation

## 2017-06-20 DIAGNOSIS — Z6833 Body mass index (BMI) 33.0-33.9, adult: Secondary | ICD-10-CM | POA: Insufficient documentation

## 2017-06-20 DIAGNOSIS — Z853 Personal history of malignant neoplasm of breast: Secondary | ICD-10-CM | POA: Insufficient documentation

## 2017-06-20 HISTORY — PX: COLONOSCOPY WITH PROPOFOL: SHX5780

## 2017-06-20 SURGERY — COLONOSCOPY WITH PROPOFOL
Anesthesia: General

## 2017-06-20 MED ORDER — EPHEDRINE SULFATE 50 MG/ML IJ SOLN
INTRAMUSCULAR | Status: DC | PRN
  Administered 2017-06-20 (×2): 10 mg via INTRAVENOUS

## 2017-06-20 MED ORDER — EPHEDRINE SULFATE 50 MG/ML IJ SOLN
INTRAMUSCULAR | Status: AC
  Filled 2017-06-20: qty 1

## 2017-06-20 MED ORDER — PROPOFOL 10 MG/ML IV BOLUS
INTRAVENOUS | Status: AC
  Filled 2017-06-20: qty 20

## 2017-06-20 MED ORDER — PROPOFOL 10 MG/ML IV BOLUS
INTRAVENOUS | Status: DC | PRN
  Administered 2017-06-20: 20 mg via INTRAVENOUS
  Administered 2017-06-20: 30 mg via INTRAVENOUS

## 2017-06-20 MED ORDER — SODIUM CHLORIDE 0.9 % IV SOLN: INTRAVENOUS | Status: DC

## 2017-06-20 MED ORDER — SODIUM CHLORIDE 0.9 % IV SOLN
INTRAVENOUS | Status: DC
  Administered 2017-06-20: 08:00:00 via INTRAVENOUS

## 2017-06-20 MED ORDER — PROPOFOL 500 MG/50ML IV EMUL
INTRAVENOUS | Status: DC | PRN
  Administered 2017-06-20: 120 ug/kg/min via INTRAVENOUS

## 2017-06-20 MED ORDER — LIDOCAINE HCL (CARDIAC) 20 MG/ML IV SOLN
INTRAVENOUS | Status: DC | PRN
  Administered 2017-06-20: 2 mL via INTRAVENOUS
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
## 2017-06-20 NOTE — Transfer of Care (Signed)
Immediate Anesthesia Transfer of Care Note  Patient: Bianca Baker  Procedure(s) Performed: Procedure(s): COLONOSCOPY WITH PROPOFOL (N/A)  Patient Location: PACU  Anesthesia Type:General  Level of Consciousness: awake  Airway & Oxygen Therapy: Patient Spontanous Breathing and Patient connected to nasal cannula oxygen  Post-op Assessment: Report given to RN and Post -op Vital signs reviewed and stable  Post vital signs: Reviewed  Last Vitals:  Vitals:   06/20/17 0729  BP: 136/89  Pulse: 79  Resp: 18  Temp: 36.8 C    Last Pain:  Vitals:   06/20/17 0729  TempSrc: Tympanic         Complications: No apparent anesthesia complications

## 2017-06-20 NOTE — Anesthesia Post-op Follow-up Note (Signed)
Anesthesia QCDR form completed.        

## 2017-06-20 NOTE — Op Note (Addendum)
[facility]astroenter[facility] Patient Name: Bianca Baker Procedure Date: 06/20/2017 8:29 AM MRN: [id number] Account #: [id number] Date of Birth: 1936-05-26 Admit Type: Outpatient Age: 81 Room: [facility] ENDO ROOM 3 Gender: Female Note Status: Finalized Procedure:            Colonoscopy Indications:          Screening for colorectal malignant neoplasm Providers:            Lollie Sails, MD Referring MD:         Christena Flake. Raechel Ache, MD (Referring MD) Medicines:            Monitored Anesthesia Care Complications:        No immediate complications. Procedure:            Pre-Anesthesia Assessment:                       - ASA Grade Assessment: II - A patient with mild                        systemic disease.                       After obtaining informed consent, the colonoscope was                        passed under direct vision. Throughout the procedure,                        the patient's blood pressure, pulse, and oxygen                        saturations were monitored continuously. The                        Colonoscope was introduced through the anus and                        advanced to the the cecum, identified by appendiceal                        orifice and ileocecal valve. The colonoscopy was                        unusually difficult due to poor bowel prep. Successful                        completion of the procedure was aided by lavage. Findings:      Multiple medium to large diverticula were found in the sigmoid colon,       descending colon and distal descending colon.      The retroflexed view of the distal rectum and anal verge was normal and       showed no anal or rectal abnormalities. There was some minimal       barotrauma in the proximal ascending, limited to the mucosa.      The digital rectal exam was normal. Impression:           - Diverticulosis in the sigmoid colon, in the                        descending colon and in  the distal  descending colon.                       - The distal rectum and anal verge are normal on                        retroflexion view.                       - No specimens collected. Recommendation:       - Discharge patient to home.                       - Repeat colonoscopy only if clinically indicated. Procedure Code(s):    --- Professional ---                       [id number], Colonoscopy, flexible; diagnostic, including                        collection of specimen(s) by brushing or washing, when                        performed (separate procedure) Diagnosis Code(s):    --- Professional ---                       Z12.11, Encounter for screening for malignant neoplasm                        of colon                       K57.30, Diverticulosis of large intestine without                        perforation or abscess without bleeding CPT copyright 2016 American Medical Association. All rights reserved. The codes documented in this report are preliminary and upon coder review may  be revised to meet current compliance requirements. Lollie Sails, MD 06/20/2017 9:12:49 AM This report has been signed electronically. Number of Addenda: 0 Note Initiated On: 06/20/2017 8:29 AM Scope Withdrawal Time: 0 hours 12 minutes 30 seconds  Total Procedure Duration: 0 hours 30 minutes 13 seconds       [facility]

## 2017-06-20 NOTE — Anesthesia Preprocedure Evaluation (Signed)
Anesthesia Evaluation  Patient identified by MRN, date of birth, ID band Patient awake    Reviewed: Allergy & Precautions, NPO status , Patient's Chart, lab work & pertinent test results  Airway Mallampati: II       Dental  (+) Upper Dentures, Lower Dentures   Pulmonary former smoker,    Pulmonary exam normal        Cardiovascular Exercise Tolerance: Good hypertension,  Rhythm:Regular     Neuro/Psych negative psych ROS   GI/Hepatic negative GI ROS, Neg liver ROS,   Endo/Other  negative endocrine ROSMorbid obesity  Renal/GU negative Renal ROS     Musculoskeletal   Abdominal Normal abdominal exam  (+)   Peds negative pediatric ROS (+)  Hematology   Anesthesia Other Findings   Reproductive/Obstetrics                             Anesthesia Physical Anesthesia Plan  ASA: II  Anesthesia Plan: General   Post-op Pain Management:    Induction: Intravenous  PONV Risk Score and Plan:   Airway Management Planned: Natural Airway and Nasal Cannula  Additional Equipment:   Intra-op Plan:   Post-operative Plan:   Informed Consent: I have reviewed the patients History and Physical, chart, labs and discussed the procedure including the risks, benefits and alternatives for the proposed anesthesia with the patient or authorized representative who has indicated his/her understanding and acceptance.     Plan Discussed with: Surgeon  Anesthesia Plan Comments:         Anesthesia Quick Evaluation

## 2017-06-20 NOTE — Anesthesia Postprocedure Evaluation (Signed)
Anesthesia Post Note  Patient: Bianca Baker  Procedure(s) Performed: Procedure(s) (LRB): COLONOSCOPY WITH PROPOFOL (N/A)  Patient location during evaluation: PACU Anesthesia Type: General Level of consciousness: awake Pain management: pain level controlled Vital Signs Assessment: post-procedure vital signs reviewed and stable Respiratory status: nonlabored ventilation Cardiovascular status: stable Anesthetic complications: no     Last Vitals:  Vitals:   06/20/17 0940 06/20/17 0950  BP: (!) 129/49 (!) 129/59  Pulse: 63 63  Resp: 17 18  Temp:      Last Pain:  Vitals:   06/20/17 0920  TempSrc: Tympanic                 VAN STAVEREN,Jennifer Payes

## 2017-06-21 ENCOUNTER — Encounter: Payer: Self-pay | Admitting: Gastroenterology

## 2017-07-07 DIAGNOSIS — L538 Other specified erythematous conditions: Secondary | ICD-10-CM | POA: Diagnosis not present

## 2017-07-07 DIAGNOSIS — L570 Actinic keratosis: Secondary | ICD-10-CM | POA: Diagnosis not present

## 2017-07-07 DIAGNOSIS — L309 Dermatitis, unspecified: Secondary | ICD-10-CM | POA: Diagnosis not present

## 2017-07-07 DIAGNOSIS — B078 Other viral warts: Secondary | ICD-10-CM | POA: Diagnosis not present

## 2017-07-07 DIAGNOSIS — L0889 Other specified local infections of the skin and subcutaneous tissue: Secondary | ICD-10-CM | POA: Diagnosis not present

## 2017-07-13 ENCOUNTER — Other Ambulatory Visit: Payer: Self-pay | Admitting: Internal Medicine

## 2017-07-13 DIAGNOSIS — Z853 Personal history of malignant neoplasm of breast: Secondary | ICD-10-CM | POA: Diagnosis not present

## 2017-07-13 DIAGNOSIS — I10 Essential (primary) hypertension: Secondary | ICD-10-CM | POA: Diagnosis not present

## 2017-07-13 DIAGNOSIS — E1122 Type 2 diabetes mellitus with diabetic chronic kidney disease: Secondary | ICD-10-CM | POA: Diagnosis not present

## 2017-07-13 DIAGNOSIS — Z1231 Encounter for screening mammogram for malignant neoplasm of breast: Secondary | ICD-10-CM

## 2017-07-13 DIAGNOSIS — M5030 Other cervical disc degeneration, unspecified cervical region: Secondary | ICD-10-CM | POA: Insufficient documentation

## 2017-07-13 DIAGNOSIS — Z79899 Other long term (current) drug therapy: Secondary | ICD-10-CM | POA: Diagnosis not present

## 2017-07-13 DIAGNOSIS — E782 Mixed hyperlipidemia: Secondary | ICD-10-CM | POA: Diagnosis not present

## 2017-07-13 DIAGNOSIS — Z23 Encounter for immunization: Secondary | ICD-10-CM | POA: Diagnosis not present

## 2017-07-13 DIAGNOSIS — E871 Hypo-osmolality and hyponatremia: Secondary | ICD-10-CM | POA: Diagnosis not present

## 2017-07-13 DIAGNOSIS — N182 Chronic kidney disease, stage 2 (mild): Secondary | ICD-10-CM | POA: Diagnosis not present

## 2017-07-13 DIAGNOSIS — E559 Vitamin D deficiency, unspecified: Secondary | ICD-10-CM | POA: Diagnosis not present

## 2017-08-08 ENCOUNTER — Ambulatory Visit
Admission: RE | Admit: 2017-08-08 | Discharge: 2017-08-08 | Disposition: A | Discharge status: Home / Self Care | Payer: PPO | Source: Ambulatory Visit | Attending: Internal Medicine | Admitting: Internal Medicine

## 2017-08-08 DIAGNOSIS — Z9889 Other specified postprocedural states: Secondary | ICD-10-CM | POA: Insufficient documentation

## 2017-08-08 DIAGNOSIS — R928 Other abnormal and inconclusive findings on diagnostic imaging of breast: Secondary | ICD-10-CM | POA: Diagnosis not present

## 2017-08-08 DIAGNOSIS — Z853 Personal history of malignant neoplasm of breast: Secondary | ICD-10-CM

## 2017-08-08 DIAGNOSIS — Z1231 Encounter for screening mammogram for malignant neoplasm of breast: Secondary | ICD-10-CM

## 2017-08-28 ENCOUNTER — Emergency Department
Admission: EM | Admit: 2017-08-28 | Discharge: 2017-08-28 | Disposition: A | Discharge status: Home / Self Care | Payer: PPO | Attending: Emergency Medicine | Admitting: Emergency Medicine

## 2017-08-28 ENCOUNTER — Encounter: Payer: Self-pay | Admitting: Emergency Medicine

## 2017-08-28 ENCOUNTER — Emergency Department: Payer: PPO

## 2017-08-28 DIAGNOSIS — Z87891 Personal history of nicotine dependence: Secondary | ICD-10-CM | POA: Insufficient documentation

## 2017-08-28 DIAGNOSIS — W19XXXA Unspecified fall, initial encounter: Secondary | ICD-10-CM

## 2017-08-28 DIAGNOSIS — Z79899 Other long term (current) drug therapy: Secondary | ICD-10-CM | POA: Insufficient documentation

## 2017-08-28 DIAGNOSIS — S4992XA Unspecified injury of left shoulder and upper arm, initial encounter: Secondary | ICD-10-CM | POA: Diagnosis not present

## 2017-08-28 DIAGNOSIS — I10 Essential (primary) hypertension: Secondary | ICD-10-CM | POA: Insufficient documentation

## 2017-08-28 DIAGNOSIS — Y93E1 Activity, personal bathing and showering: Secondary | ICD-10-CM | POA: Insufficient documentation

## 2017-08-28 DIAGNOSIS — R51 Headache: Secondary | ICD-10-CM | POA: Diagnosis not present

## 2017-08-28 DIAGNOSIS — Z853 Personal history of malignant neoplasm of breast: Secondary | ICD-10-CM | POA: Diagnosis not present

## 2017-08-28 DIAGNOSIS — Y929 Unspecified place or not applicable: Secondary | ICD-10-CM | POA: Diagnosis not present

## 2017-08-28 DIAGNOSIS — S0003XA Contusion of scalp, initial encounter: Secondary | ICD-10-CM | POA: Insufficient documentation

## 2017-08-28 DIAGNOSIS — Z7982 Long term (current) use of aspirin: Secondary | ICD-10-CM | POA: Insufficient documentation

## 2017-08-28 DIAGNOSIS — Y999 Unspecified external cause status: Secondary | ICD-10-CM | POA: Insufficient documentation

## 2017-08-28 DIAGNOSIS — W010XXA Fall on same level from slipping, tripping and stumbling without subsequent striking against object, initial encounter: Secondary | ICD-10-CM | POA: Diagnosis not present

## 2017-08-28 DIAGNOSIS — M25512 Pain in left shoulder: Secondary | ICD-10-CM | POA: Diagnosis not present

## 2017-08-28 DIAGNOSIS — S0990XA Unspecified injury of head, initial encounter: Secondary | ICD-10-CM | POA: Diagnosis not present

## 2017-08-28 MED ORDER — HYDROCODONE-ACETAMINOPHEN 5-325 MG PO TABS
1.0000 | ORAL_TABLET | Freq: Once | ORAL | Status: AC
  Administered 2017-08-28: 1 via ORAL
  Filled 2017-08-28: qty 1

## 2017-08-28 MED ORDER — HYDROCODONE-ACETAMINOPHEN 5-325 MG PO TABS: 1.0000 | ORAL_TABLET | Freq: Four times a day (QID) | ORAL | 0 refills | Status: DC | PRN

## 2017-08-28 NOTE — ED Triage Notes (Signed)
Fell in shower, slipped on tub mat.  Patient hit back of head and left shoulder.  No LOC.  Referred to ED from [facility].

## 2017-08-28 NOTE — ED Notes (Signed)
See triage note.  Pt fell in bathtub and hit the back of head.  Pt denies LOC.

## 2017-08-28 NOTE — ED Notes (Signed)
Pt discharged to home.  Family member driving.  Discharge instructions reviewed.  Verbalized understanding.  No questions or concerns at this time.  Teach back verified.  Pt in NAD.  No items left in ED.   
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
## 2017-08-28 NOTE — Discharge Instructions (Signed)
Continue to monitor symptoms if you notice severe headache,vision changes, nausea, vomiting do not hesitate to return the emergency Department immediately.  Follow up with Dr. Theodore Demark office regarding left shoulder pain as soon as you are able to schedule an appointment.

## 2017-08-30 ENCOUNTER — Encounter: Payer: Self-pay | Admitting: Emergency Medicine

## 2017-08-30 ENCOUNTER — Emergency Department
Admission: EM | Admit: 2017-08-30 | Discharge: 2017-08-30 | Disposition: A | Discharge status: Home / Self Care | Payer: PPO | Attending: Emergency Medicine | Admitting: Emergency Medicine

## 2017-08-30 ENCOUNTER — Emergency Department: Payer: PPO

## 2017-08-30 DIAGNOSIS — Z87891 Personal history of nicotine dependence: Secondary | ICD-10-CM | POA: Diagnosis not present

## 2017-08-30 DIAGNOSIS — S0990XA Unspecified injury of head, initial encounter: Secondary | ICD-10-CM | POA: Diagnosis not present

## 2017-08-30 DIAGNOSIS — I10 Essential (primary) hypertension: Secondary | ICD-10-CM | POA: Diagnosis not present

## 2017-08-30 DIAGNOSIS — Z96659 Presence of unspecified artificial knee joint: Secondary | ICD-10-CM | POA: Insufficient documentation

## 2017-08-30 DIAGNOSIS — Z7982 Long term (current) use of aspirin: Secondary | ICD-10-CM | POA: Diagnosis not present

## 2017-08-30 DIAGNOSIS — Z79899 Other long term (current) drug therapy: Secondary | ICD-10-CM | POA: Diagnosis not present

## 2017-08-30 DIAGNOSIS — R42 Dizziness and giddiness: Secondary | ICD-10-CM | POA: Insufficient documentation

## 2017-08-30 DIAGNOSIS — R51 Headache: Secondary | ICD-10-CM | POA: Diagnosis not present

## 2017-08-30 LAB — CBC WITH DIFFERENTIAL/PLATELET
BASOS PCT: 1 %
Basophils Absolute: 0.1 10*3/uL (ref 0–0.1)
EOS ABS: 0.1 10*3/uL (ref 0–0.7)
Eosinophils Relative: 1 %
HEMATOCRIT: 39.3 % (ref 35.0–47.0)
HEMOGLOBIN: 13.2 g/dL (ref 12.0–16.0)
Lymphocytes Relative: 37 %
Lymphs Abs: 3.5 10*3/uL (ref 1.0–3.6)
MCH: 28.6 pg (ref 26.0–34.0)
MCHC: 33.7 g/dL (ref 32.0–36.0)
MCV: 84.9 fL (ref 80.0–100.0)
MONOS PCT: 7 %
Monocytes Absolute: 0.7 10*3/uL (ref 0.2–0.9)
NEUTROS ABS: 5.2 10*3/uL (ref 1.4–6.5)
NEUTROS PCT: 54 %
Platelets: 295 10*3/uL (ref 150–440)
RBC: 4.62 MIL/uL (ref 3.80–5.20)
RDW: 14.3 % (ref 11.5–14.5)
WBC: 9.5 10*3/uL (ref 3.6–11.0)

## 2017-08-30 LAB — URINALYSIS, COMPLETE (UACMP) WITH MICROSCOPIC
BACTERIA UA: NONE SEEN
Bilirubin Urine: NEGATIVE
Glucose, UA: NEGATIVE mg/dL
Hgb urine dipstick: NEGATIVE
Ketones, ur: NEGATIVE mg/dL
Leukocytes, UA: NEGATIVE
Nitrite: NEGATIVE
PH: 7 (ref 5.0–8.0)
Protein, ur: NEGATIVE mg/dL
RBC / HPF: NONE SEEN RBC/hpf (ref 0–5)
SPECIFIC GRAVITY, URINE: 1.005 (ref 1.005–1.030)
Squamous Epithelial / LPF: NONE SEEN

## 2017-08-30 LAB — BASIC METABOLIC PANEL
Anion gap: 11 (ref 5–15)
BUN: 15 mg/dL (ref 6–20)
CALCIUM: 9.3 mg/dL (ref 8.9–10.3)
CHLORIDE: 92 mmol/L — AB (ref 101–111)
CO2: 27 mmol/L (ref 22–32)
CREATININE: 0.89 mg/dL (ref 0.44–1.00)
GFR calc Af Amer: >60 mL/min (ref >60)
GFR calc non Af Amer: >60 mL/min (ref >60)
Glucose, Bld: 101 mg/dL — ABNORMAL HIGH (ref 65–99)
Potassium: 3.5 mmol/L (ref 3.5–5.1)
SODIUM: 130 mmol/L — AB (ref 135–145)

## 2017-08-30 MED ORDER — SODIUM CHLORIDE 0.9 % IV BOLUS (SEPSIS)
1000.0000 mL | Freq: Once | INTRAVENOUS | Status: AC
  Administered 2017-08-30: 1000 mL via INTRAVENOUS

## 2017-08-30 MED ORDER — MECLIZINE HCL 25 MG PO TABS: 25.0000 mg | ORAL_TABLET | Freq: Three times a day (TID) | ORAL | 0 refills | Status: DC | PRN

## 2017-08-30 MED ORDER — MECLIZINE HCL 25 MG PO TABS
25.0000 mg | ORAL_TABLET | Freq: Once | ORAL | Status: AC
  Administered 2017-08-30: 25 mg via ORAL
  Filled 2017-08-30: qty 1

## 2017-08-30 NOTE — Discharge Instructions (Signed)
Re-check your blood pressure in the morning. If it is persistently elevated, call your doctor as soon as possible. Return to the ER for chest pain, shortness of breath, headaches, changes in vision, slurred speech, facial droop, difficulty finding words, unilateral weakness or numbness. You may take meclizine as needed for these episodes of dizziness. Follow-up with her primary care doctor in 2-3 days.
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
## 2017-08-30 NOTE — ED Triage Notes (Signed)
Seen through ED 2 days ago after falling -- shoulder and head injury.  Today, started feeling "odd, weird, dizzy, shaky".  Denies N/V

## 2017-09-05 DIAGNOSIS — M7582 Other shoulder lesions, left shoulder: Secondary | ICD-10-CM | POA: Diagnosis not present

## 2017-09-08 DIAGNOSIS — D485 Neoplasm of uncertain behavior of skin: Secondary | ICD-10-CM | POA: Diagnosis not present

## 2017-09-08 DIAGNOSIS — B079 Viral wart, unspecified: Secondary | ICD-10-CM | POA: Diagnosis not present

## 2017-09-08 DIAGNOSIS — L309 Dermatitis, unspecified: Secondary | ICD-10-CM | POA: Diagnosis not present

## 2018-01-04 DIAGNOSIS — M170 Bilateral primary osteoarthritis of knee: Secondary | ICD-10-CM | POA: Insufficient documentation

## 2018-01-04 DIAGNOSIS — E782 Mixed hyperlipidemia: Secondary | ICD-10-CM | POA: Diagnosis not present

## 2018-01-04 DIAGNOSIS — E559 Vitamin D deficiency, unspecified: Secondary | ICD-10-CM | POA: Diagnosis not present

## 2018-01-04 DIAGNOSIS — F5102 Adjustment insomnia: Secondary | ICD-10-CM | POA: Diagnosis not present

## 2018-01-04 DIAGNOSIS — Z0000 Encounter for general adult medical examination without abnormal findings: Secondary | ICD-10-CM | POA: Diagnosis not present

## 2018-01-04 DIAGNOSIS — I10 Essential (primary) hypertension: Secondary | ICD-10-CM | POA: Diagnosis not present

## 2018-01-04 DIAGNOSIS — N182 Chronic kidney disease, stage 2 (mild): Secondary | ICD-10-CM | POA: Diagnosis not present

## 2018-01-04 DIAGNOSIS — E1122 Type 2 diabetes mellitus with diabetic chronic kidney disease: Secondary | ICD-10-CM | POA: Diagnosis not present

## 2018-02-02 DIAGNOSIS — H04123 Dry eye syndrome of bilateral lacrimal glands: Secondary | ICD-10-CM | POA: Diagnosis not present

## 2018-03-01 DIAGNOSIS — I10 Essential (primary) hypertension: Secondary | ICD-10-CM | POA: Diagnosis not present

## 2018-03-01 DIAGNOSIS — A09 Infectious gastroenteritis and colitis, unspecified: Secondary | ICD-10-CM | POA: Diagnosis not present

## 2018-03-01 DIAGNOSIS — F5109 Other insomnia not due to a substance or known physiological condition: Secondary | ICD-10-CM | POA: Diagnosis not present

## 2018-03-01 DIAGNOSIS — E559 Vitamin D deficiency, unspecified: Secondary | ICD-10-CM | POA: Diagnosis not present

## 2018-03-01 DIAGNOSIS — E1122 Type 2 diabetes mellitus with diabetic chronic kidney disease: Secondary | ICD-10-CM | POA: Diagnosis not present

## 2018-03-01 DIAGNOSIS — N182 Chronic kidney disease, stage 2 (mild): Secondary | ICD-10-CM | POA: Diagnosis not present

## 2018-03-01 DIAGNOSIS — M170 Bilateral primary osteoarthritis of knee: Secondary | ICD-10-CM | POA: Diagnosis not present

## 2018-03-02 ENCOUNTER — Emergency Department
Admission: EM | Admit: 2018-03-02 | Discharge: 2018-03-02 | Disposition: A | Discharge status: Home / Self Care | Payer: PPO | Attending: Emergency Medicine | Admitting: Emergency Medicine

## 2018-03-02 ENCOUNTER — Encounter: Payer: Self-pay | Admitting: Emergency Medicine

## 2018-03-02 ENCOUNTER — Other Ambulatory Visit: Payer: Self-pay

## 2018-03-02 DIAGNOSIS — I10 Essential (primary) hypertension: Secondary | ICD-10-CM | POA: Insufficient documentation

## 2018-03-02 DIAGNOSIS — Z7982 Long term (current) use of aspirin: Secondary | ICD-10-CM | POA: Insufficient documentation

## 2018-03-02 DIAGNOSIS — F410 Panic disorder [episodic paroxysmal anxiety] without agoraphobia: Secondary | ICD-10-CM

## 2018-03-02 DIAGNOSIS — Z87891 Personal history of nicotine dependence: Secondary | ICD-10-CM | POA: Insufficient documentation

## 2018-03-02 DIAGNOSIS — Z79899 Other long term (current) drug therapy: Secondary | ICD-10-CM | POA: Diagnosis not present

## 2018-03-02 NOTE — ED Notes (Signed)
Pt is upset with the nights events at her home and feels she can't get any rest or peace. Resting comfortably warm blanket given.
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
## 2018-03-02 NOTE — Discharge Instructions (Signed)
Your workup in the Emergency Department today was reassuring.  We did not find any specific abnormalities.  We recommend you drink plenty of fluids, take your regular medications and/or any new ones prescribed today, and follow up with the doctor(s) listed in these documents as recommended.  Return to the Emergency Department if you develop new or worsening symptoms that concern you.  
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
## 2018-03-16 DIAGNOSIS — H04123 Dry eye syndrome of bilateral lacrimal glands: Secondary | ICD-10-CM | POA: Diagnosis not present

## 2018-04-19 DIAGNOSIS — H2511 Age-related nuclear cataract, right eye: Secondary | ICD-10-CM | POA: Diagnosis not present

## 2018-04-26 ENCOUNTER — Other Ambulatory Visit: Payer: Self-pay

## 2018-04-27 NOTE — Discharge Instructions (Signed)

## 2018-04-30 ENCOUNTER — Encounter
Admission: RE | Disposition: A | Discharge status: Home / Self Care | Payer: Self-pay | Source: Ambulatory Visit | Attending: Ophthalmology

## 2018-04-30 ENCOUNTER — Ambulatory Visit
Admission: RE | Admit: 2018-04-30 | Discharge: 2018-04-30 | Disposition: A | Discharge status: Home / Self Care | Payer: PPO | Source: Ambulatory Visit | Attending: Ophthalmology | Admitting: Ophthalmology

## 2018-04-30 ENCOUNTER — Ambulatory Visit: Payer: PPO | Admitting: Anesthesiology

## 2018-04-30 DIAGNOSIS — Z853 Personal history of malignant neoplasm of breast: Secondary | ICD-10-CM | POA: Diagnosis not present

## 2018-04-30 DIAGNOSIS — H25811 Combined forms of age-related cataract, right eye: Secondary | ICD-10-CM | POA: Diagnosis not present

## 2018-04-30 DIAGNOSIS — E119 Type 2 diabetes mellitus without complications: Secondary | ICD-10-CM | POA: Diagnosis not present

## 2018-04-30 DIAGNOSIS — Z96652 Presence of left artificial knee joint: Secondary | ICD-10-CM | POA: Insufficient documentation

## 2018-04-30 DIAGNOSIS — Z87891 Personal history of nicotine dependence: Secondary | ICD-10-CM | POA: Diagnosis not present

## 2018-04-30 DIAGNOSIS — H2511 Age-related nuclear cataract, right eye: Secondary | ICD-10-CM | POA: Diagnosis not present

## 2018-04-30 DIAGNOSIS — I10 Essential (primary) hypertension: Secondary | ICD-10-CM | POA: Diagnosis not present

## 2018-04-30 HISTORY — DX: Gastro-esophageal reflux disease without esophagitis: K21.9

## 2018-04-30 HISTORY — DX: Presence of dental prosthetic device (complete) (partial): Z97.2

## 2018-04-30 HISTORY — DX: Depression, unspecified: F32.A

## 2018-04-30 HISTORY — DX: Unspecified hearing loss, unspecified ear: H91.90

## 2018-04-30 HISTORY — PX: CATARACT EXTRACTION W/PHACO: SHX586

## 2018-04-30 HISTORY — DX: Major depressive disorder, single episode, unspecified: F32.9

## 2018-04-30 HISTORY — DX: Anxiety disorder, unspecified: F41.9

## 2018-04-30 HISTORY — DX: Type 2 diabetes mellitus without complications: E11.9

## 2018-04-30 SURGERY — PHACOEMULSIFICATION, CATARACT, WITH IOL INSERTION
Anesthesia: Monitor Anesthesia Care | Site: Eye | Laterality: Right | Wound class: "Clean "

## 2018-04-30 MED ORDER — LACTATED RINGERS IV SOLN: 10.0000 mL/h | INTRAVENOUS | Status: DC

## 2018-04-30 MED ORDER — MOXIFLOXACIN HCL 0.5 % OP SOLN
OPHTHALMIC | Status: DC | PRN
  Administered 2018-04-30: 0.2 mL via OPHTHALMIC

## 2018-04-30 MED ORDER — ARMC OPHTHALMIC DILATING DROPS
1.0000 "application " | OPHTHALMIC | Status: DC | PRN
  Administered 2018-04-30 (×3): 1 via OPHTHALMIC

## 2018-04-30 MED ORDER — ONDANSETRON HCL 4 MG/2ML IJ SOLN: 4.0000 mg | Freq: Once | INTRAMUSCULAR | Status: DC | PRN

## 2018-04-30 MED ORDER — SODIUM HYALURONATE 23 MG/ML IO SOLN
INTRAOCULAR | Status: DC | PRN
  Administered 2018-04-30: 0.6 mL via INTRAOCULAR

## 2018-04-30 MED ORDER — LIDOCAINE HCL (PF) 2 % IJ SOLN
INTRAOCULAR | Status: DC | PRN
  Administered 2018-04-30: 1 mL via INTRAOCULAR

## 2018-04-30 MED ORDER — MIDAZOLAM HCL 2 MG/2ML IJ SOLN
INTRAMUSCULAR | Status: DC | PRN
  Administered 2018-04-30: 1 mg via INTRAVENOUS

## 2018-04-30 MED ORDER — FENTANYL CITRATE (PF) 100 MCG/2ML IJ SOLN
INTRAMUSCULAR | Status: DC | PRN
  Administered 2018-04-30: 50 ug via INTRAVENOUS

## 2018-04-30 MED ORDER — EPINEPHRINE PF 1 MG/ML IJ SOLN
INTRAOCULAR | Status: DC | PRN
  Administered 2018-04-30: 75 mL via OPHTHALMIC

## 2018-04-30 MED ORDER — SODIUM HYALURONATE 10 MG/ML IO SOLN
INTRAOCULAR | Status: DC | PRN
  Administered 2018-04-30: 0.55 mL via INTRAOCULAR

## 2018-04-30 SURGICAL SUPPLY — 17 items
CANNULA ANT/CHMB 27G (MISCELLANEOUS) ×1 IMPLANT
CANNULA ANT/CHMB 27GA (MISCELLANEOUS) ×3 IMPLANT
DISSECTOR HYDRO NUCLEUS 50X22 (MISCELLANEOUS) ×3 IMPLANT
GLOVE BIO SURGEON STRL SZ8 (GLOVE) ×3 IMPLANT
GLOVE SURG LX 7.5 STRW (GLOVE) ×2
GLOVE SURG LX STRL 7.5 STRW (GLOVE) ×1 IMPLANT
GOWN STRL REUS W/ TWL LRG LVL3 (GOWN DISPOSABLE) ×2 IMPLANT
GOWN STRL REUS W/TWL LRG LVL3 (GOWN DISPOSABLE) ×4
LENS IOL TECNIS ITEC 20.5 (Intraocular Lens) ×2 IMPLANT
MARKER SKIN DUAL TIP RULER LAB (MISCELLANEOUS) ×3 IMPLANT
PACK CATARACT (MISCELLANEOUS) ×3 IMPLANT
PACK DR. KING ARMS (PACKS) ×3 IMPLANT
PACK EYE AFTER SURG (MISCELLANEOUS) ×3 IMPLANT
SYR 3ML LL SCALE MARK (SYRINGE) ×3 IMPLANT
SYR TB 1ML LUER SLIP (SYRINGE) ×3 IMPLANT
WATER STERILE IRR 500ML POUR (IV SOLUTION) ×3 IMPLANT
WIPE NON LINTING 3.25X3.25 (MISCELLANEOUS) ×3 IMPLANT
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
## 2018-04-30 NOTE — Anesthesia Postprocedure Evaluation (Signed)
Anesthesia Post Note  Patient: [name]  Procedure(s) Performed: CATARACT EXTRACTION PHACO AND INTRAOCULAR LENS PLACEMENT (IOC) RIGHT DIABETIC (Right Eye)  Patient location during evaluation: PACU Anesthesia Type: MAC Level of consciousness: awake and alert, oriented and patient cooperative Pain management: pain level controlled Vital Signs Assessment: post-procedure vital signs reviewed and stable Respiratory status: spontaneous breathing, nonlabored ventilation and respiratory function stable Cardiovascular status: blood pressure returned to baseline and stable Postop Assessment: adequate PO intake Anesthetic complications: no    Darrin Nipper

## 2018-04-30 NOTE — Transfer of Care (Signed)
Immediate Anesthesia Transfer of Care Note  Patient: Bianca Baker  Procedure(s) Performed: CATARACT EXTRACTION PHACO AND INTRAOCULAR LENS PLACEMENT (IOC) RIGHT DIABETIC (Right Eye)  Patient Location: PACU  Anesthesia Type: MAC  Level of Consciousness: awake, alert  and patient cooperative  Airway and Oxygen Therapy: Patient Spontanous Breathing and Patient connected to supplemental oxygen  Post-op Assessment: Post-op Vital signs reviewed, Patient's Cardiovascular Status Stable, Respiratory Function Stable, Patent Airway and No signs of Nausea or vomiting  Post-op Vital Signs: Reviewed and stable  Complications: No apparent anesthesia complications

## 2018-04-30 NOTE — Op Note (Signed)
OPERATIVE NOTE  Bianca Baker [id number] 04/30/2018   PREOPERATIVE DIAGNOSIS:  Nuclear sclerotic cataract right eye.  H25.11   POSTOPERATIVE DIAGNOSIS:    Nuclear sclerotic cataract right eye.     PROCEDURE:  Phacoemusification with posterior chamber intraocular lens placement of the right eye   LENS:   Implant Name Type Inv. Item Serial No. Manufacturer Lot No. LRB No. Used  LENS IOL DIOP 20.5 - [id number] Intraocular Lens LENS IOL DIOP 20.5 [id number] AMO  Right 1       PCB00 +20.5   ULTRASOUND TIME: 0 minutes 53 seconds.  CDE 7.21   SURGEON:  Benay Pillow, MD, MPH  ANESTHESIOLOGIST: Anesthesiologist: Darrin Nipper, MD CRNA: Mayme Genta, CRNA   ANESTHESIA:  Topical with tetracaine drops augmented with 1% preservative-free intracameral lidocaine.  ESTIMATED BLOOD LOSS: less than 1 mL.   COMPLICATIONS:  None.   DESCRIPTION OF PROCEDURE:  The patient was identified in the holding room and transported to the operating room and placed in the supine position under the operating microscope.  The right eye was identified as the operative eye and it was prepped and draped in the usual sterile ophthalmic fashion.   A 1.0 millimeter clear-corneal paracentesis was made at the 10:30 position. 0.5 ml of preservative-free 1% lidocaine with epinephrine was injected into the anterior chamber.  The anterior chamber was filled with Healon 5 viscoelastic.  A 2.4 millimeter keratome was used to make a near-clear corneal incision at the 8:00 position.  A curvilinear capsulorrhexis was made with a cystotome and capsulorrhexis forceps.  Balanced salt solution was used to hydrodissect and hydrodelineate the nucleus.   Phacoemulsification was then used in stop and chop fashion to remove the lens nucleus and epinucleus.  The remaining cortex was then removed using the irrigation and aspiration handpiece. Healon was then placed into the capsular bag to distend it for lens placement.  A lens was  then injected into the capsular bag.  The remaining viscoelastic was aspirated.   Wounds were hydrated with balanced salt solution.  The anterior chamber was inflated to a physiologic pressure with balanced salt solution.   Intracameral vigamox 0.1 mL undiluted was injected into the eye and a drop placed onto the ocular surface.  No wound leaks were noted.  The patient was taken to the recovery room in stable condition without complications of anesthesia or surgery  Benay Pillow 04/30/2018, 7:52 AM

## 2018-04-30 NOTE — H&P (Signed)
The History and Physical notes are on paper, have been signed, and are to be scanned.   I have examined the patient and there are no changes to the H&P.   Benay Pillow 04/30/2018 7:09 AM

## 2018-04-30 NOTE — Anesthesia Procedure Notes (Signed)
Procedure Name: MAC Performed by: Sheyla Zaffino, CRNA Pre-anesthesia Checklist: Patient identified, Emergency Drugs available, Suction available, Timeout performed and Patient being monitored Patient Re-evaluated:Patient Re-evaluated prior to induction Oxygen Delivery Method: Nasal cannula Placement Confirmation: positive ETCO2       

## 2018-05-01 ENCOUNTER — Encounter: Payer: Self-pay | Admitting: Ophthalmology

## 2018-06-16 ENCOUNTER — Other Ambulatory Visit: Payer: Self-pay

## 2018-06-16 ENCOUNTER — Emergency Department
Admission: EM | Admit: 2018-06-16 | Discharge: 2018-06-16 | Disposition: A | Discharge status: Home / Self Care | Payer: PPO | Attending: Emergency Medicine | Admitting: Emergency Medicine

## 2018-06-16 ENCOUNTER — Encounter: Payer: Self-pay | Admitting: Emergency Medicine

## 2018-06-16 DIAGNOSIS — R531 Weakness: Secondary | ICD-10-CM | POA: Insufficient documentation

## 2018-06-16 DIAGNOSIS — E876 Hypokalemia: Secondary | ICD-10-CM | POA: Insufficient documentation

## 2018-06-16 DIAGNOSIS — Z853 Personal history of malignant neoplasm of breast: Secondary | ICD-10-CM | POA: Diagnosis not present

## 2018-06-16 DIAGNOSIS — I10 Essential (primary) hypertension: Secondary | ICD-10-CM | POA: Diagnosis not present

## 2018-06-16 DIAGNOSIS — R55 Syncope and collapse: Secondary | ICD-10-CM | POA: Insufficient documentation

## 2018-06-16 DIAGNOSIS — Z923 Personal history of irradiation: Secondary | ICD-10-CM | POA: Insufficient documentation

## 2018-06-16 DIAGNOSIS — E860 Dehydration: Secondary | ICD-10-CM | POA: Insufficient documentation

## 2018-06-16 DIAGNOSIS — Z9221 Personal history of antineoplastic chemotherapy: Secondary | ICD-10-CM | POA: Diagnosis not present

## 2018-06-16 DIAGNOSIS — Z7982 Long term (current) use of aspirin: Secondary | ICD-10-CM | POA: Diagnosis not present

## 2018-06-16 DIAGNOSIS — E119 Type 2 diabetes mellitus without complications: Secondary | ICD-10-CM | POA: Insufficient documentation

## 2018-06-16 DIAGNOSIS — R61 Generalized hyperhidrosis: Secondary | ICD-10-CM | POA: Diagnosis not present

## 2018-06-16 DIAGNOSIS — R002 Palpitations: Secondary | ICD-10-CM | POA: Diagnosis not present

## 2018-06-16 DIAGNOSIS — Z96652 Presence of left artificial knee joint: Secondary | ICD-10-CM | POA: Insufficient documentation

## 2018-06-16 DIAGNOSIS — Z87891 Personal history of nicotine dependence: Secondary | ICD-10-CM | POA: Diagnosis not present

## 2018-06-16 DIAGNOSIS — Z79899 Other long term (current) drug therapy: Secondary | ICD-10-CM | POA: Diagnosis not present

## 2018-06-16 LAB — BASIC METABOLIC PANEL
Anion gap: 12 (ref 5–15)
BUN: 16 mg/dL (ref 8–23)
CALCIUM: 9.8 mg/dL (ref 8.9–10.3)
CO2: 27 mmol/L (ref 22–32)
Chloride: 92 mmol/L — ABNORMAL LOW (ref 98–111)
Creatinine, Ser: 0.96 mg/dL (ref 0.44–1.00)
GFR calc Af Amer: >60 mL/min (ref >60)
GFR calc non Af Amer: 54 mL/min — ABNORMAL LOW (ref >60)
Glucose, Bld: 116 mg/dL — ABNORMAL HIGH (ref 70–99)
Potassium: 3 mmol/L — ABNORMAL LOW (ref 3.5–5.1)
Sodium: 131 mmol/L — ABNORMAL LOW (ref 135–145)

## 2018-06-16 LAB — URINALYSIS, COMPLETE (UACMP) WITH MICROSCOPIC
Bacteria, UA: NONE SEEN
Bilirubin Urine: NEGATIVE
GLUCOSE, UA: NEGATIVE mg/dL
Hgb urine dipstick: NEGATIVE
Ketones, ur: NEGATIVE mg/dL
Nitrite: NEGATIVE
PH: 7 (ref 5.0–8.0)
Protein, ur: NEGATIVE mg/dL
SPECIFIC GRAVITY, URINE: 1.01 (ref 1.005–1.030)

## 2018-06-16 LAB — CBC
HCT: 43.7 % (ref 35.0–47.0)
Hemoglobin: 14.7 g/dL (ref 12.0–16.0)
MCH: 28.5 pg (ref 26.0–34.0)
MCHC: 33.7 g/dL (ref 32.0–36.0)
MCV: 84.7 fL (ref 80.0–100.0)
PLATELETS: 356 10*3/uL (ref 150–440)
RBC: 5.16 MIL/uL (ref 3.80–5.20)
RDW: 14.9 % — ABNORMAL HIGH (ref 11.5–14.5)
WBC: 14.7 10*3/uL — ABNORMAL HIGH (ref 3.6–11.0)

## 2018-06-16 MED ORDER — POTASSIUM CHLORIDE CRYS ER 20 MEQ PO TBCR
40.0000 meq | EXTENDED_RELEASE_TABLET | Freq: Once | ORAL | Status: AC
  Administered 2018-06-16: 40 meq via ORAL
  Filled 2018-06-16: qty 2

## 2018-06-16 MED ORDER — SODIUM CHLORIDE 0.9 % IV BOLUS
1000.0000 mL | Freq: Once | INTRAVENOUS | Status: AC
  Administered 2018-06-16: 1000 mL via INTRAVENOUS

## 2018-06-16 NOTE — Discharge Instructions (Addendum)
Please seek medical attention for any high fevers, chest pain, shortness of breath, change in behavior, persistent vomiting, bloody stool or any other new or concerning symptoms.  

## 2018-06-16 NOTE — ED Notes (Signed)
Per pt she had a sudden episode of weakness and lowered herself to the kitchen floor. Denies loc. Alert oriented. Denies previous illness or dysuria

## 2018-06-16 NOTE — ED Triage Notes (Signed)
Pt to ED via POV with c/o near syncopal episode today, denies LOC . Denies CP. Grips equal bilat, speech clear. NAD noted .
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
## 2018-06-20 ENCOUNTER — Other Ambulatory Visit: Payer: Self-pay | Admitting: *Deleted

## 2018-06-20 NOTE — Patient Outreach (Signed)
[facility]) Care Management  06/20/2018  Bianca Baker Apr 17, 1936 [id number]  Referral from [facility]; patient fainting or passing out; patient was advised to be taken to ED or PCP triage.   Telephone call to patient who was advised of reason for call.  HIPPA verification received.   Patient voices that she went to ED and found out that she had low sodium & potassium and was dehydrated.    States she received IV fluids and is feeling much better today. States she has not felt faint today. States she is drinking necessary fluids as ordered to maintain her protein, sodium and potassium level.    States she has follow up appointment scheduled with her primary care provider on 8/12. Voices that she has transportation to appointment.   Patient states she has HTN and daughter takes her blood pressure for her. States she is taking her medications as ordered. Patient advised of RN Health Coach for management of her HTN. Patient declined service and states she knows what she needs to do to maintain her blood pressure.   Voices no further concerns.  Plan; Case closure.   Sherrin Daisy, RN BSN [place] Management Coordinator [facility] Care Management  [phone]

## 2018-06-25 ENCOUNTER — Other Ambulatory Visit: Payer: Self-pay | Admitting: *Deleted

## 2018-06-25 DIAGNOSIS — N182 Chronic kidney disease, stage 2 (mild): Secondary | ICD-10-CM | POA: Diagnosis not present

## 2018-06-25 DIAGNOSIS — I10 Essential (primary) hypertension: Secondary | ICD-10-CM | POA: Diagnosis not present

## 2018-06-25 DIAGNOSIS — Z79899 Other long term (current) drug therapy: Secondary | ICD-10-CM | POA: Diagnosis not present

## 2018-06-25 DIAGNOSIS — N183 Chronic kidney disease, stage 3 unspecified: Secondary | ICD-10-CM | POA: Insufficient documentation

## 2018-06-25 DIAGNOSIS — E1122 Type 2 diabetes mellitus with diabetic chronic kidney disease: Secondary | ICD-10-CM | POA: Diagnosis not present

## 2018-06-25 DIAGNOSIS — D72829 Elevated white blood cell count, unspecified: Secondary | ICD-10-CM | POA: Diagnosis not present

## 2018-06-25 DIAGNOSIS — R55 Syncope and collapse: Secondary | ICD-10-CM | POA: Diagnosis not present

## 2018-06-25 DIAGNOSIS — E782 Mixed hyperlipidemia: Secondary | ICD-10-CM | POA: Diagnosis not present

## 2018-06-25 DIAGNOSIS — E559 Vitamin D deficiency, unspecified: Secondary | ICD-10-CM | POA: Diagnosis not present

## 2018-06-25 DIAGNOSIS — E871 Hypo-osmolality and hyponatremia: Secondary | ICD-10-CM | POA: Diagnosis not present

## 2018-06-25 NOTE — Patient Outreach (Signed)
[facility]) Care Management  06/25/2018  Bianca Baker 18-Jun-1936 [id number]   Telephone Screen  Referral Date:  06/22/18 Referral Source: Nurse call line HTA Referral Reason:weakness Seen in ED and given protein pills, for low LDL now having constipated stools and weakness Nurse line referred her to [facility] ED (? GI bleed) for black tarry stools reported  Insurance: [employer] (HTA)  Outreach attempt # 1 successful at her home number  Patient is able to verify HIPAA Reviewed and addressed referral to [facility] with patient Bianca Baker confirms she did not go to [facility] ED but went to pcp today 06/25/18 She reports the primary MD did not see issue but took labs and will call her if note issues with her labs. She reports she might need "protein pills" [facility] RN CM discussed low iron, iron tablets related to stools [facility] RN CM encouraged increased fluids, increase vegetables, warm to hot fluids, stool softener (colace) ,  Bianca Baker reports use of miralax   She reports her last stool was on Sunday 06/24/18   Bianca Baker also discussed that her buttocks/sacrum area is sore She reports she sat down vs falling on 06/16/18 (lowered self to kitchen floor) and bruised bottom  Daughter noted redness of her bottom after the incident  [facility] RN Cm recommend pressure relief with a  donut hole or neck pillow She was encouraged to have her daughter to examine the area if she could not do it herself and call the MD office.  She reports mentioning it to MD today but reports no interventions discussed   Social:  Bianca Baker is widowed and lives with her son. She reports being independent with her ADLs and needing assist with iADLs She denies transportation issues or issues with medications   Conditions: HTN, borderline DM per pt, hx of diverticulitis, left breast cancer hx, cataract right eye, hyperlipidemia, arthritis   Medications: denies concerns with taking medications as prescribed,  affording medications, side effects of medications and questions about medications  Appointments: Seen by Dr Raechel Ache today 06/25/18   Advance Directives: Denies need for assist with or assist with changes for advance directives   Consent: [facility] RN CM reviewed [facility] services with patient. She denies need of services from [facility]/Telephonic RN CM, pharmacy or SW at this time  Plan: [facility] CM will close case at this time as patient has been assessed and no needs identified.    Bianca L. Lavina Hamman, RN, BSN, [place] Management Care Coordinator Direct Number [phone] Mobile number [phone]  Main [facility] number [phone] Fax number [phone]

## 2018-07-13 ENCOUNTER — Other Ambulatory Visit: Payer: Self-pay | Admitting: Internal Medicine

## 2018-07-13 DIAGNOSIS — Z1239 Encounter for other screening for malignant neoplasm of breast: Secondary | ICD-10-CM | POA: Diagnosis not present

## 2018-07-13 DIAGNOSIS — G8929 Other chronic pain: Secondary | ICD-10-CM | POA: Diagnosis not present

## 2018-07-13 DIAGNOSIS — Z1231 Encounter for screening mammogram for malignant neoplasm of breast: Secondary | ICD-10-CM

## 2018-07-13 DIAGNOSIS — E782 Mixed hyperlipidemia: Secondary | ICD-10-CM | POA: Diagnosis not present

## 2018-07-13 DIAGNOSIS — E559 Vitamin D deficiency, unspecified: Secondary | ICD-10-CM | POA: Diagnosis not present

## 2018-07-13 DIAGNOSIS — E1122 Type 2 diabetes mellitus with diabetic chronic kidney disease: Secondary | ICD-10-CM | POA: Diagnosis not present

## 2018-07-13 DIAGNOSIS — M25561 Pain in right knee: Secondary | ICD-10-CM | POA: Diagnosis not present

## 2018-07-13 DIAGNOSIS — Z79899 Other long term (current) drug therapy: Secondary | ICD-10-CM | POA: Diagnosis not present

## 2018-07-13 DIAGNOSIS — N182 Chronic kidney disease, stage 2 (mild): Secondary | ICD-10-CM | POA: Diagnosis not present

## 2018-07-13 DIAGNOSIS — I10 Essential (primary) hypertension: Secondary | ICD-10-CM | POA: Diagnosis not present

## 2018-07-13 DIAGNOSIS — Z853 Personal history of malignant neoplasm of breast: Secondary | ICD-10-CM | POA: Diagnosis not present

## 2018-07-23 DIAGNOSIS — M25561 Pain in right knee: Secondary | ICD-10-CM | POA: Diagnosis not present

## 2018-07-23 DIAGNOSIS — M1711 Unilateral primary osteoarthritis, right knee: Secondary | ICD-10-CM | POA: Diagnosis not present

## 2018-07-25 DIAGNOSIS — H2512 Age-related nuclear cataract, left eye: Secondary | ICD-10-CM | POA: Diagnosis not present

## 2018-07-31 ENCOUNTER — Encounter: Payer: Self-pay | Admitting: *Deleted

## 2018-07-31 ENCOUNTER — Other Ambulatory Visit: Payer: Self-pay

## 2018-08-02 NOTE — Discharge Instructions (Signed)

## 2018-08-03 MED ORDER — SUCCINYLCHOLINE CHLORIDE 20 MG/ML IJ SOLN
INTRAMUSCULAR | Status: AC
  Filled 2018-08-03: qty 1

## 2018-08-06 ENCOUNTER — Ambulatory Visit: Payer: PPO | Admitting: Anesthesiology

## 2018-08-06 ENCOUNTER — Encounter
Admission: RE | Disposition: A | Discharge status: Home / Self Care | Payer: Self-pay | Source: Ambulatory Visit | Attending: Ophthalmology

## 2018-08-06 ENCOUNTER — Ambulatory Visit
Admission: RE | Admit: 2018-08-06 | Discharge: 2018-08-06 | Disposition: A | Discharge status: Home / Self Care | Payer: PPO | Source: Ambulatory Visit | Attending: Ophthalmology | Admitting: Ophthalmology

## 2018-08-06 DIAGNOSIS — H25812 Combined forms of age-related cataract, left eye: Secondary | ICD-10-CM | POA: Diagnosis not present

## 2018-08-06 DIAGNOSIS — E119 Type 2 diabetes mellitus without complications: Secondary | ICD-10-CM | POA: Diagnosis not present

## 2018-08-06 DIAGNOSIS — I10 Essential (primary) hypertension: Secondary | ICD-10-CM | POA: Diagnosis not present

## 2018-08-06 DIAGNOSIS — Z96652 Presence of left artificial knee joint: Secondary | ICD-10-CM | POA: Diagnosis not present

## 2018-08-06 DIAGNOSIS — Z853 Personal history of malignant neoplasm of breast: Secondary | ICD-10-CM | POA: Diagnosis not present

## 2018-08-06 DIAGNOSIS — H2512 Age-related nuclear cataract, left eye: Secondary | ICD-10-CM | POA: Diagnosis not present

## 2018-08-06 DIAGNOSIS — Z87891 Personal history of nicotine dependence: Secondary | ICD-10-CM | POA: Insufficient documentation

## 2018-08-06 HISTORY — PX: CATARACT EXTRACTION W/PHACO: SHX586

## 2018-08-06 SURGERY — PHACOEMULSIFICATION, CATARACT, WITH IOL INSERTION
Anesthesia: Monitor Anesthesia Care | Laterality: Left

## 2018-08-06 MED ORDER — TETRACAINE HCL 0.5 % OP SOLN
1.0000 [drp] | OPHTHALMIC | Status: DC | PRN
  Administered 2018-08-06 (×2): 1 [drp] via OPHTHALMIC

## 2018-08-06 MED ORDER — MOXIFLOXACIN HCL 0.5 % OP SOLN
OPHTHALMIC | Status: DC | PRN
  Administered 2018-08-06: 0.2 mL via OPHTHALMIC

## 2018-08-06 MED ORDER — LIDOCAINE HCL (PF) 2 % IJ SOLN
INTRAOCULAR | Status: DC | PRN
  Administered 2018-08-06: 1 mL via INTRAOCULAR

## 2018-08-06 MED ORDER — ONDANSETRON HCL 4 MG/2ML IJ SOLN: 4.0000 mg | Freq: Once | INTRAMUSCULAR | Status: DC | PRN

## 2018-08-06 MED ORDER — SODIUM HYALURONATE 23 MG/ML IO SOLN
INTRAOCULAR | Status: DC | PRN
  Administered 2018-08-06: 0.6 mL via INTRAOCULAR

## 2018-08-06 MED ORDER — EPINEPHRINE PF 1 MG/ML IJ SOLN
INTRAOCULAR | Status: DC | PRN
  Administered 2018-08-06: 110 mL via OPHTHALMIC

## 2018-08-06 MED ORDER — MIDAZOLAM HCL 2 MG/2ML IJ SOLN
INTRAMUSCULAR | Status: DC | PRN
  Administered 2018-08-06: 1.5 mg via INTRAVENOUS

## 2018-08-06 MED ORDER — LACTATED RINGERS IV SOLN: 1000.0000 mL | INTRAVENOUS | Status: DC

## 2018-08-06 MED ORDER — SODIUM HYALURONATE 10 MG/ML IO SOLN
INTRAOCULAR | Status: DC | PRN
  Administered 2018-08-06: 0.55 mL via INTRAOCULAR

## 2018-08-06 MED ORDER — ARMC OPHTHALMIC DILATING DROPS
1.0000 | OPHTHALMIC | Status: DC | PRN
Start: 2018-08-06 — End: 2018-08-06
  Administered 2018-08-06 (×3): 1 via OPHTHALMIC

## 2018-08-06 MED ORDER — FENTANYL CITRATE (PF) 100 MCG/2ML IJ SOLN
INTRAMUSCULAR | Status: DC | PRN
  Administered 2018-08-06: 50 ug via INTRAVENOUS

## 2018-08-06 SURGICAL SUPPLY — 17 items
CANNULA ANT/CHMB 27G (MISCELLANEOUS) ×1 IMPLANT
CANNULA ANT/CHMB 27GA (MISCELLANEOUS) ×3 IMPLANT
DISSECTOR HYDRO NUCLEUS 50X22 (MISCELLANEOUS) ×3 IMPLANT
GLOVE BIO SURGEON STRL SZ8 (GLOVE) ×3 IMPLANT
GLOVE SURG LX 7.5 STRW (GLOVE) ×2
GLOVE SURG LX STRL 7.5 STRW (GLOVE) ×1 IMPLANT
GOWN STRL REUS W/ TWL LRG LVL3 (GOWN DISPOSABLE) ×2 IMPLANT
GOWN STRL REUS W/TWL LRG LVL3 (GOWN DISPOSABLE) ×4
LENS IOL TECNIS ITEC 19.0 (Intraocular Lens) ×2 IMPLANT
MARKER SKIN DUAL TIP RULER LAB (MISCELLANEOUS) ×3 IMPLANT
PACK DR. KING ARMS (PACKS) ×3 IMPLANT
PACK EYE AFTER SURG (MISCELLANEOUS) ×3 IMPLANT
PACK OPTHALMIC (MISCELLANEOUS) ×3 IMPLANT
SYR 3ML LL SCALE MARK (SYRINGE) ×3 IMPLANT
SYR TB 1ML LUER SLIP (SYRINGE) ×3 IMPLANT
WATER STERILE IRR 500ML POUR (IV SOLUTION) ×3 IMPLANT
WIPE NON LINTING 3.25X3.25 (MISCELLANEOUS) ×3 IMPLANT

## 2018-08-06 NOTE — H&P (Signed)
The History and Physical notes are on paper, have been signed, and are to be scanned.   I have examined the patient and there are no changes to the H&P.   Benay Pillow 08/06/2018 7:25 AM

## 2018-08-06 NOTE — Op Note (Signed)
OPERATIVE NOTE  Bianca Baker [id number] 08/06/2018   PREOPERATIVE DIAGNOSIS:  Nuclear sclerotic cataract left eye.  H25.12   POSTOPERATIVE DIAGNOSIS:    Nuclear sclerotic cataract left eye.     PROCEDURE:  Phacoemusification with posterior chamber intraocular lens placement of the left eye   LENS:   Implant Name Type Inv. Item Serial No. Manufacturer Lot No. LRB No. Used  LENS IOL DIOP 19.0 - [id number] Intraocular Lens LENS IOL DIOP 19.0 [id number] AMO  Left 1       PCB00 +19.0   ULTRASOUND TIME: 0 minutes 59 seconds.  CDE 8.64   SURGEON:  Benay Pillow, MD, MPH   ANESTHESIA:  Topical with tetracaine drops augmented with 1% preservative-free intracameral lidocaine.  ESTIMATED BLOOD LOSS: <1 mL   COMPLICATIONS:  None.   DESCRIPTION OF PROCEDURE:  The patient was identified in the holding room and transported to the operating room and placed in the supine position under the operating microscope.  The left eye was identified as the operative eye and it was prepped and draped in the usual sterile ophthalmic fashion.   A 1.0 millimeter clear-corneal paracentesis was made at the 5:00 position. 0.5 ml of preservative-free 1% lidocaine with epinephrine was injected into the anterior chamber.  The anterior chamber was filled with Healon 5 viscoelastic.  A 2.4 millimeter keratome was used to make a near-clear corneal incision at the 2:00 position.  A curvilinear capsulorrhexis was made with a cystotome and capsulorrhexis forceps.  Balanced salt solution was used to hydrodissect and hydrodelineate the nucleus.   Phacoemulsification was then used in stop and chop fashion to remove the lens nucleus and epinucleus.  The remaining cortex was then removed using the irrigation and aspiration handpiece. Healon was then placed into the capsular bag to distend it for lens placement.  A lens was then injected into the capsular bag.  The remaining viscoelastic was aspirated.   Wounds were hydrated  with balanced salt solution.  The anterior chamber was inflated to a physiologic pressure with balanced salt solution.  Intracameral vigamox 0.1 mL undiltued was injected into the eye and a drop placed onto the ocular surface.  No wound leaks were noted.  The patient was taken to the recovery room in stable condition without complications of anesthesia or surgery  Benay Pillow 08/06/2018, 7:57 AM
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
## 2018-08-06 NOTE — Transfer of Care (Signed)
Immediate Anesthesia Transfer of Care Note  Patient: Bianca Baker  Procedure(s) Performed: CATARACT EXTRACTION PHACO AND INTRAOCULAR LENS PLACEMENT (IOC) LEFT (Left )  Patient Location: PACU  Anesthesia Type: MAC  Level of Consciousness: awake, alert  and patient cooperative  Airway and Oxygen Therapy: Patient Spontanous Breathing and Patient connected to supplemental oxygen  Post-op Assessment: Post-op Vital signs reviewed, Patient's Cardiovascular Status Stable, Respiratory Function Stable, Patent Airway and No signs of Nausea or vomiting  Post-op Vital Signs: Reviewed and stable  Complications: No apparent anesthesia complications

## 2018-08-06 NOTE — Anesthesia Postprocedure Evaluation (Signed)
Anesthesia Post Note  Patient: Bianca Baker  Procedure(s) Performed: CATARACT EXTRACTION PHACO AND INTRAOCULAR LENS PLACEMENT (IOC) LEFT (Left )  Patient location during evaluation: PACU Anesthesia Type: MAC Level of consciousness: awake and alert Pain management: pain level controlled Vital Signs Assessment: post-procedure vital signs reviewed and stable Respiratory status: spontaneous breathing, nonlabored ventilation, respiratory function stable and patient connected to nasal cannula oxygen Cardiovascular status: stable and blood pressure returned to baseline Postop Assessment: no apparent nausea or vomiting Anesthetic complications: no    Veda Canning

## 2018-08-06 NOTE — Anesthesia Procedure Notes (Signed)
Procedure Name: MAC Performed by: Loucinda Croy, CRNA Pre-anesthesia Checklist: Patient identified, Emergency Drugs available, Suction available, Timeout performed and Patient being monitored Patient Re-evaluated:Patient Re-evaluated prior to induction Oxygen Delivery Method: Nasal cannula Placement Confirmation: positive ETCO2       

## 2018-08-07 ENCOUNTER — Encounter: Payer: Self-pay | Admitting: Ophthalmology

## 2018-08-08 ENCOUNTER — Other Ambulatory Visit: Payer: Self-pay

## 2018-08-08 ENCOUNTER — Emergency Department
Admission: EM | Admit: 2018-08-08 | Discharge: 2018-08-08 | Disposition: A | Discharge status: Home / Self Care | Payer: PPO | Source: Home / Self Care | Attending: Emergency Medicine | Admitting: Emergency Medicine

## 2018-08-08 ENCOUNTER — Encounter: Payer: Self-pay | Admitting: *Deleted

## 2018-08-08 DIAGNOSIS — F329 Major depressive disorder, single episode, unspecified: Secondary | ICD-10-CM | POA: Insufficient documentation

## 2018-08-08 DIAGNOSIS — Z96652 Presence of left artificial knee joint: Secondary | ICD-10-CM | POA: Insufficient documentation

## 2018-08-08 DIAGNOSIS — Z87891 Personal history of nicotine dependence: Secondary | ICD-10-CM

## 2018-08-08 DIAGNOSIS — Z888 Allergy status to other drugs, medicaments and biological substances status: Secondary | ICD-10-CM | POA: Diagnosis not present

## 2018-08-08 DIAGNOSIS — R251 Tremor, unspecified: Secondary | ICD-10-CM | POA: Insufficient documentation

## 2018-08-08 DIAGNOSIS — Z7982 Long term (current) use of aspirin: Secondary | ICD-10-CM | POA: Insufficient documentation

## 2018-08-08 DIAGNOSIS — T502X5A Adverse effect of carbonic-anhydrase inhibitors, benzothiadiazides and other diuretics, initial encounter: Secondary | ICD-10-CM | POA: Diagnosis not present

## 2018-08-08 DIAGNOSIS — Z853 Personal history of malignant neoplasm of breast: Secondary | ICD-10-CM | POA: Insufficient documentation

## 2018-08-08 DIAGNOSIS — Z79899 Other long term (current) drug therapy: Secondary | ICD-10-CM

## 2018-08-08 DIAGNOSIS — E119 Type 2 diabetes mellitus without complications: Secondary | ICD-10-CM | POA: Insufficient documentation

## 2018-08-08 DIAGNOSIS — E876 Hypokalemia: Secondary | ICD-10-CM

## 2018-08-08 DIAGNOSIS — E871 Hypo-osmolality and hyponatremia: Secondary | ICD-10-CM | POA: Diagnosis not present

## 2018-08-08 DIAGNOSIS — Z9221 Personal history of antineoplastic chemotherapy: Secondary | ICD-10-CM | POA: Diagnosis not present

## 2018-08-08 DIAGNOSIS — E785 Hyperlipidemia, unspecified: Secondary | ICD-10-CM | POA: Diagnosis not present

## 2018-08-08 DIAGNOSIS — F419 Anxiety disorder, unspecified: Secondary | ICD-10-CM

## 2018-08-08 DIAGNOSIS — I10 Essential (primary) hypertension: Secondary | ICD-10-CM

## 2018-08-08 DIAGNOSIS — E860 Dehydration: Secondary | ICD-10-CM | POA: Diagnosis not present

## 2018-08-08 DIAGNOSIS — M1990 Unspecified osteoarthritis, unspecified site: Secondary | ICD-10-CM | POA: Diagnosis not present

## 2018-08-08 DIAGNOSIS — H9191 Unspecified hearing loss, right ear: Secondary | ICD-10-CM | POA: Diagnosis not present

## 2018-08-08 DIAGNOSIS — R258 Other abnormal involuntary movements: Secondary | ICD-10-CM | POA: Diagnosis not present

## 2018-08-08 LAB — BASIC METABOLIC PANEL
ANION GAP: 10 (ref 5–15)
BUN: 15 mg/dL (ref 8–23)
CHLORIDE: 94 mmol/L — AB (ref 98–111)
CO2: 28 mmol/L (ref 22–32)
Calcium: 9.3 mg/dL (ref 8.9–10.3)
Creatinine, Ser: 0.9 mg/dL (ref 0.44–1.00)
GFR, EST NON AFRICAN AMERICAN: 59 mL/min — AB (ref >60)
Glucose, Bld: 133 mg/dL — ABNORMAL HIGH (ref 70–99)
Potassium: 3.3 mmol/L — ABNORMAL LOW (ref 3.5–5.1)
SODIUM: 132 mmol/L — AB (ref 135–145)

## 2018-08-08 LAB — CBC
HEMATOCRIT: 39.2 % (ref 35.0–47.0)
HEMOGLOBIN: 13.4 g/dL (ref 12.0–16.0)
MCH: 28.7 pg (ref 26.0–34.0)
MCHC: 34.1 g/dL (ref 32.0–36.0)
MCV: 84.1 fL (ref 80.0–100.0)
Platelets: 342 10*3/uL (ref 150–440)
RBC: 4.66 MIL/uL (ref 3.80–5.20)
RDW: 14.4 % (ref 11.5–14.5)
WBC: 11.3 10*3/uL — AB (ref 3.6–11.0)

## 2018-08-08 LAB — TROPONIN I: Troponin I: <0.03 ng/mL (ref <0.03)

## 2018-08-08 LAB — GLUCOSE, CAPILLARY: Glucose-Capillary: 137 mg/dL — ABNORMAL HIGH (ref 70–99)

## 2018-08-08 MED ORDER — POTASSIUM CHLORIDE CRYS ER 20 MEQ PO TBCR
40.0000 meq | EXTENDED_RELEASE_TABLET | Freq: Once | ORAL | Status: AC
  Administered 2018-08-08: 40 meq via ORAL
  Filled 2018-08-08: qty 2

## 2018-08-08 MED ORDER — POTASSIUM CHLORIDE CRYS ER 20 MEQ PO TBCR: 20.0000 meq | EXTENDED_RELEASE_TABLET | Freq: Every day | ORAL | 0 refills | Status: DC

## 2018-08-08 NOTE — Discharge Instructions (Signed)
Your workup in the Emergency Department today was reassuring.  We did not find any specific abnormalities other than a slightly low potassium level for which we gave you a supplement and a prescription.  We recommend you drink plenty of fluids, take your regular medications and/or any new ones prescribed today, and follow up with the doctor(s) listed in these documents as recommended.  Return to the Emergency Department if you develop new or worsening symptoms that concern you.
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
## 2018-08-08 NOTE — ED Notes (Signed)
Pt states had cataract surgery two days ago and went to try to get groceries. Pt states when she came home she felt weak and started shaking. Pt states has hx of same. Pt AxOx4.

## 2018-08-08 NOTE — ED Triage Notes (Signed)
Pt reports feeling jumpy and having tremors today.  Pt also reports nausea today.  Pt denies any pain.  Pt had recent cataract surgery. Hx panic attack.  Pt alert speech clear.  fsbs 137 in triage.

## 2018-08-09 ENCOUNTER — Inpatient Hospital Stay: Admission: RE | Admit: 2018-08-09 | Payer: PPO | Source: Ambulatory Visit

## 2018-08-11 ENCOUNTER — Emergency Department: Payer: PPO

## 2018-08-11 ENCOUNTER — Encounter: Payer: Self-pay | Admitting: Emergency Medicine

## 2018-08-11 ENCOUNTER — Other Ambulatory Visit: Payer: Self-pay

## 2018-08-11 ENCOUNTER — Inpatient Hospital Stay
Admission: EM | Admit: 2018-08-11 | Discharge: 2018-08-12 | DRG: 641 | Disposition: A | Discharge status: Home / Self Care | Payer: PPO | Attending: Internal Medicine | Admitting: Internal Medicine

## 2018-08-11 DIAGNOSIS — H9191 Unspecified hearing loss, right ear: Secondary | ICD-10-CM | POA: Diagnosis present

## 2018-08-11 DIAGNOSIS — Z87891 Personal history of nicotine dependence: Secondary | ICD-10-CM | POA: Diagnosis not present

## 2018-08-11 DIAGNOSIS — E119 Type 2 diabetes mellitus without complications: Secondary | ICD-10-CM | POA: Diagnosis present

## 2018-08-11 DIAGNOSIS — Z7982 Long term (current) use of aspirin: Secondary | ICD-10-CM

## 2018-08-11 DIAGNOSIS — E871 Hypo-osmolality and hyponatremia: Secondary | ICD-10-CM | POA: Diagnosis not present

## 2018-08-11 DIAGNOSIS — Z79899 Other long term (current) drug therapy: Secondary | ICD-10-CM | POA: Diagnosis not present

## 2018-08-11 DIAGNOSIS — T502X5A Adverse effect of carbonic-anhydrase inhibitors, benzothiadiazides and other diuretics, initial encounter: Secondary | ICD-10-CM | POA: Diagnosis not present

## 2018-08-11 DIAGNOSIS — E876 Hypokalemia: Secondary | ICD-10-CM | POA: Diagnosis present

## 2018-08-11 DIAGNOSIS — E785 Hyperlipidemia, unspecified: Secondary | ICD-10-CM | POA: Diagnosis present

## 2018-08-11 DIAGNOSIS — R531 Weakness: Secondary | ICD-10-CM | POA: Diagnosis not present

## 2018-08-11 DIAGNOSIS — R5383 Other fatigue: Secondary | ICD-10-CM | POA: Diagnosis not present

## 2018-08-11 DIAGNOSIS — E860 Dehydration: Secondary | ICD-10-CM | POA: Diagnosis present

## 2018-08-11 DIAGNOSIS — Z888 Allergy status to other drugs, medicaments and biological substances status: Secondary | ICD-10-CM

## 2018-08-11 DIAGNOSIS — I10 Essential (primary) hypertension: Secondary | ICD-10-CM | POA: Diagnosis not present

## 2018-08-11 DIAGNOSIS — Z853 Personal history of malignant neoplasm of breast: Secondary | ICD-10-CM | POA: Diagnosis not present

## 2018-08-11 DIAGNOSIS — Z9221 Personal history of antineoplastic chemotherapy: Secondary | ICD-10-CM | POA: Diagnosis not present

## 2018-08-11 DIAGNOSIS — Z96652 Presence of left artificial knee joint: Secondary | ICD-10-CM | POA: Diagnosis not present

## 2018-08-11 DIAGNOSIS — M1990 Unspecified osteoarthritis, unspecified site: Secondary | ICD-10-CM | POA: Diagnosis not present

## 2018-08-11 LAB — CBC
HEMATOCRIT: 38.6 % (ref 35.0–47.0)
Hemoglobin: 13.5 g/dL (ref 12.0–16.0)
MCH: 29.5 pg (ref 26.0–34.0)
MCHC: 35 g/dL (ref 32.0–36.0)
MCV: 84.2 fL (ref 80.0–100.0)
Platelets: 316 10*3/uL (ref 150–440)
RBC: 4.58 MIL/uL (ref 3.80–5.20)
RDW: 14.5 % (ref 11.5–14.5)
WBC: 10.2 10*3/uL (ref 3.6–11.0)

## 2018-08-11 LAB — CREATININE, SERUM: Creatinine, Ser: 0.83 mg/dL (ref 0.44–1.00)

## 2018-08-11 LAB — SODIUM, URINE, RANDOM: Sodium, Ur: 27 mmol/L

## 2018-08-11 MED ORDER — ONDANSETRON HCL 4 MG PO TABS: 4.0000 mg | ORAL_TABLET | Freq: Four times a day (QID) | ORAL | Status: DC | PRN

## 2018-08-11 MED ORDER — ONDANSETRON HCL 4 MG/2ML IJ SOLN: 4.0000 mg | Freq: Four times a day (QID) | INTRAMUSCULAR | Status: DC | PRN

## 2018-08-11 MED ORDER — POTASSIUM CHLORIDE CRYS ER 20 MEQ PO TBCR: 20.0000 meq | EXTENDED_RELEASE_TABLET | Freq: Every day | ORAL | Status: DC

## 2018-08-11 MED ORDER — BISACODYL 5 MG PO TBEC: 5.0000 mg | DELAYED_RELEASE_TABLET | Freq: Every day | ORAL | Status: DC | PRN

## 2018-08-11 MED ORDER — SODIUM CHLORIDE 0.9 % IV SOLN
INTRAVENOUS | Status: DC
  Administered 2018-08-11 – 2018-08-12 (×2): via INTRAVENOUS

## 2018-08-11 MED ORDER — TRAZODONE HCL 50 MG PO TABS: 25.0000 mg | ORAL_TABLET | Freq: Every evening | ORAL | Status: DC | PRN

## 2018-08-11 MED ORDER — INFLUENZA VAC SPLIT HIGH-DOSE 0.5 ML IM SUSY
0.5000 mL | PREFILLED_SYRINGE | INTRAMUSCULAR | Status: DC
  Filled 2018-08-11: qty 0.5

## 2018-08-11 MED ORDER — ACETAMINOPHEN 325 MG PO TABS: 650.0000 mg | ORAL_TABLET | Freq: Four times a day (QID) | ORAL | Status: DC | PRN

## 2018-08-11 MED ORDER — ACETAMINOPHEN 650 MG RE SUPP: 650.0000 mg | Freq: Four times a day (QID) | RECTAL | Status: DC | PRN

## 2018-08-11 MED ORDER — METOPROLOL SUCCINATE ER 50 MG PO TB24
50.0000 mg | ORAL_TABLET | Freq: Every day | ORAL | Status: DC
  Administered 2018-08-12: 08:00:00 50 mg via ORAL
  Filled 2018-08-11: qty 1

## 2018-08-11 MED ORDER — HYDROCODONE-ACETAMINOPHEN 5-325 MG PO TABS: 1.0000 | ORAL_TABLET | ORAL | Status: DC | PRN

## 2018-08-11 MED ORDER — ENOXAPARIN SODIUM 40 MG/0.4ML ~~LOC~~ SOLN
40.0000 mg | SUBCUTANEOUS | Status: DC
  Administered 2018-08-11: 21:00:00 40 mg via SUBCUTANEOUS
  Filled 2018-08-11: qty 0.4

## 2018-08-11 MED ORDER — VITAMIN D 1000 UNITS PO TABS: 2000.0000 [IU] | ORAL_TABLET | ORAL | Status: DC

## 2018-08-11 MED ORDER — HEPARIN SODIUM (PORCINE) 5000 UNIT/ML IJ SOLN: 5000.0000 [IU] | Freq: Three times a day (TID) | INTRAMUSCULAR | Status: DC

## 2018-08-11 MED ORDER — ASPIRIN EC 81 MG PO TBEC
81.0000 mg | DELAYED_RELEASE_TABLET | Freq: Every day | ORAL | Status: DC
  Administered 2018-08-12: 08:00:00 81 mg via ORAL
  Filled 2018-08-11: qty 1

## 2018-08-11 MED ORDER — ADULT MULTIVITAMIN W/MINERALS CH
1.0000 | ORAL_TABLET | Freq: Every day | ORAL | Status: DC
  Administered 2018-08-12: 1 via ORAL
  Filled 2018-08-11: qty 1

## 2018-08-11 MED ORDER — PREDNISOLONE ACETATE 1 % OP SUSP
1.0000 [drp] | Freq: Four times a day (QID) | OPHTHALMIC | Status: DC
  Administered 2018-08-11: 1 [drp] via OPHTHALMIC
  Filled 2018-08-11: qty 1

## 2018-08-11 MED ORDER — DOCUSATE SODIUM 100 MG PO CAPS
100.0000 mg | ORAL_CAPSULE | Freq: Two times a day (BID) | ORAL | Status: DC
  Administered 2018-08-11: 21:00:00 100 mg via ORAL
  Filled 2018-08-11 (×2): qty 1

## 2018-08-11 NOTE — ED Notes (Signed)
Pt given meal tray and juice to drink ?

## 2018-08-11 NOTE — ED Notes (Signed)
Pt returned from xray
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
## 2018-08-11 NOTE — Plan of Care (Signed)
  Problem: Education: Goal: Knowledge of General Education information will improve Description Including pain rating scale, medication(s)/side effects and non-pharmacologic comfort measures Outcome: Progressing   Problem: Health Behavior/Discharge Planning: Goal: Ability to manage health-related needs will improve Outcome: Progressing   Problem: Clinical Measurements: Goal: Ability to maintain clinical measurements within normal limits will improve Outcome: Progressing Goal: Will remain free from infection Outcome: Progressing Goal: Diagnostic test results will improve Outcome: Progressing   Problem: Activity: Goal: Risk for activity intolerance will decrease Outcome: Progressing   Problem: Elimination: Goal: Will not experience complications related to bowel motility Outcome: Progressing Goal: Will not experience complications related to urinary retention Outcome: Progressing   Problem: Pain Managment: Goal: General experience of comfort will improve Outcome: Progressing   Problem: Safety: Goal: Ability to remain free from injury will improve Outcome: Progressing

## 2018-08-11 NOTE — ED Triage Notes (Signed)
Pt was seen and [place] and sent over due to a sodium level of 125. Pt denies any pain but states she feels weak and tired.
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
## 2018-08-11 NOTE — ED Notes (Signed)
Pt taken to xray 

## 2018-08-12 DIAGNOSIS — E871 Hypo-osmolality and hyponatremia: Secondary | ICD-10-CM | POA: Diagnosis not present

## 2018-08-12 DIAGNOSIS — I10 Essential (primary) hypertension: Secondary | ICD-10-CM | POA: Diagnosis not present

## 2018-08-12 LAB — BASIC METABOLIC PANEL
Anion gap: 12 (ref 5–15)
BUN: 17 mg/dL (ref 8–23)
CHLORIDE: 94 mmol/L — AB (ref 98–111)
CO2: 25 mmol/L (ref 22–32)
CREATININE: 0.81 mg/dL (ref 0.44–1.00)
Calcium: 8.6 mg/dL — ABNORMAL LOW (ref 8.9–10.3)
GFR calc Af Amer: >60 mL/min (ref >60)
GFR calc non Af Amer: >60 mL/min (ref >60)
Glucose, Bld: 99 mg/dL (ref 70–99)
POTASSIUM: 3.9 mmol/L (ref 3.5–5.1)
Sodium: 131 mmol/L — ABNORMAL LOW (ref 135–145)

## 2018-08-12 LAB — CBC
HEMATOCRIT: 36.7 % (ref 35.0–47.0)
Hemoglobin: 12.6 g/dL (ref 12.0–16.0)
MCH: 29.3 pg (ref 26.0–34.0)
MCHC: 34.4 g/dL (ref 32.0–36.0)
MCV: 85.2 fL (ref 80.0–100.0)
PLATELETS: 288 10*3/uL (ref 150–440)
RBC: 4.31 MIL/uL (ref 3.80–5.20)
RDW: 14.4 % (ref 11.5–14.5)
WBC: 8.8 10*3/uL (ref 3.6–11.0)

## 2018-08-12 LAB — GLUCOSE, CAPILLARY
GLUCOSE-CAPILLARY: 94 mg/dL (ref 70–99)
Glucose-Capillary: 110 mg/dL — ABNORMAL HIGH (ref 70–99)

## 2018-08-12 MED ORDER — VALSARTAN 160 MG PO TABS: 160.0000 mg | ORAL_TABLET | Freq: Every day | ORAL | 0 refills | Status: DC
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
## 2018-08-12 NOTE — Progress Notes (Signed)
   08/12/18 1440  Clinical Encounter Type  Visited With Patient not available  Visit Type Initial (order for AD)  Referral From Physician  Consult/Referral To Chaplain   Chaplain responded to order request.  Patient not in room; per chart note, patient has discharged.  Order cleared.

## 2018-08-12 NOTE — Progress Notes (Signed)
Family Meeting Note  Advance Directive:no  Today a meeting took place with the Patient.and daughter  The following clinical team members were present during this meeting:MD  The following were discussed:Patient's diagnosis: hyponatremia HTN, Patient's progosis: > 12 months and Goals for treatment: Full Code  Additional follow-up to be provided: Chaplain consultation to create advanced directives  Time spent during discussion: 16 minutes  Jayceon Troy, MD

## 2018-08-12 NOTE — Progress Notes (Addendum)
MD order received in [facility] to discharge pt home today; verbally reviewed AVS with pt, gave Rx to pt for Valsartan; no questions voiced at this time; pt's discharge pending her eating lunch; verbally instructed pt to call when she is ready to go in order to get her a wheelchair for discharge

## 2018-08-12 NOTE — Progress Notes (Signed)
Pt ready for discharge; pt discharged via wheelchair by auxillary to the visitor's entrance 

## 2018-08-13 ENCOUNTER — Other Ambulatory Visit: Payer: Self-pay | Admitting: *Deleted

## 2018-08-13 NOTE — Patient Outreach (Addendum)
[facility]) Care Management  08/13/2018  Bianca Baker June 13, 1936 [id number]   Telephone Screen  Referral Date: 08/08/18 Referral Source: nurse call center  Referral Reason: anxiety and panic attack, cataract procedure yesterday and became overwhelmed on 08/07/18, restless and legs are jumping, trembling,  RN recommended go to ED now or pcp triage  Insurance: HTA  Plus She called the RN line over the last weekend on 08/11/18 with the c/o generalized weakness after being seen at the ER on 08/10/18 Her potassium was low per the patient. The RN recommend she see pcp within 24 hours   Outreach attempt # 1 No answer. [facility] RN CM left HIPAA compliant voicemail message along with CM's contact info.   Plan: [facility] RN CM sent an unsuccessful outreach letter and scheduled this patient for another call attempt within 4 business days   Eureka Valdes L. Lavina Hamman, RN, BSN, [place] Coordinator Office number [phone] Mobile number [phone]  Main [facility] number [phone] Fax number [phone]

## 2018-08-14 ENCOUNTER — Ambulatory Visit: Payer: Self-pay | Admitting: *Deleted

## 2018-08-14 DIAGNOSIS — N182 Chronic kidney disease, stage 2 (mild): Secondary | ICD-10-CM | POA: Diagnosis not present

## 2018-08-14 DIAGNOSIS — R7989 Other specified abnormal findings of blood chemistry: Secondary | ICD-10-CM | POA: Diagnosis not present

## 2018-08-14 DIAGNOSIS — Z23 Encounter for immunization: Secondary | ICD-10-CM | POA: Diagnosis not present

## 2018-08-14 DIAGNOSIS — E1122 Type 2 diabetes mellitus with diabetic chronic kidney disease: Secondary | ICD-10-CM | POA: Diagnosis not present

## 2018-08-14 DIAGNOSIS — I10 Essential (primary) hypertension: Secondary | ICD-10-CM | POA: Diagnosis not present

## 2018-08-14 DIAGNOSIS — E871 Hypo-osmolality and hyponatremia: Secondary | ICD-10-CM | POA: Diagnosis not present

## 2018-08-14 DIAGNOSIS — R946 Abnormal results of thyroid function studies: Secondary | ICD-10-CM | POA: Diagnosis not present

## 2018-08-14 DIAGNOSIS — T502X5A Adverse effect of carbonic-anhydrase inhibitors, benzothiadiazides and other diuretics, initial encounter: Secondary | ICD-10-CM | POA: Diagnosis not present

## 2018-08-14 DIAGNOSIS — E876 Hypokalemia: Secondary | ICD-10-CM | POA: Diagnosis not present

## 2018-08-15 ENCOUNTER — Ambulatory Visit
Admission: RE | Admit: 2018-08-15 | Discharge: 2018-08-15 | Disposition: A | Discharge status: Home / Self Care | Payer: PPO | Source: Ambulatory Visit | Attending: Internal Medicine | Admitting: Internal Medicine

## 2018-08-15 ENCOUNTER — Other Ambulatory Visit: Payer: Self-pay | Admitting: *Deleted

## 2018-08-15 DIAGNOSIS — Z1231 Encounter for screening mammogram for malignant neoplasm of breast: Secondary | ICD-10-CM | POA: Diagnosis not present

## 2018-08-15 HISTORY — DX: Personal history of antineoplastic chemotherapy: Z92.21

## 2018-08-15 HISTORY — DX: Personal history of irradiation: Z92.3
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
## 2018-08-16 ENCOUNTER — Other Ambulatory Visit: Payer: Self-pay

## 2018-08-16 NOTE — Patient Outreach (Signed)
[facility]) Care Management  [place]   08/16/2018  CHANTEL TETI 21-Jan-1936 [id number]  82 year old female outreached by [place] services for a 30 day post discharge medication review.  PMHx includes, but not limited to, hypertension, type 2 diabetes mellitus, chronic renal insufficiency Stage 3, h/o breast cancer.  Successful outreach to Ms. Chovanec.  HIPAA identifiers verified.   Subjective: Ms. Manfred Shirts reports that she is feeling well.  She states that she will complete her last day of potassium chloride today.   She reports that her blood pressure pill was changed because her sodium was low.  She states that she has borderline diabetes, but diet controlled and does not check her glucose.  Objective:  SCr 0.81mg /dL  Current Medications: Current Outpatient Medications  Medication Sig Dispense Refill  . aspirin 81 MG tablet Take 81 mg by mouth daily.    . Cholecalciferol (VITAMIN D3) 1000 UNITS CAPS Take 2,000 Units by mouth daily.     . metoprolol succinate (TOPROL-XL) 50 MG 24 hr tablet Take 50 mg by mouth daily.   3  . Multiple Vitamins-Minerals (ONE-A-DAY WOMENS 50+ ADVANTAGE PO) Take 1 tablet by mouth daily.    . valsartan (DIOVAN) 160 MG tablet Take 1 tablet (160 mg total) by mouth daily. 30 tablet 0  . acetaminophen (TYLENOL) 500 MG tablet Take 500 mg by mouth every 6 (six) hours as needed.     No current facility-administered medications for this visit.     Functional Status: In your present state of health, do you have any difficulty performing the following activities: 08/11/2018 08/06/2018  Hearing? N N  Vision? N N  Difficulty concentrating or making decisions? N N  Walking or climbing stairs? N N  Dressing or bathing? N N  Doing errands, shopping? N -  Some recent data might be hidden    Fall/Depression Screening: No flowsheet data found. PHQ 2/9 Scores 06/25/2018  PHQ - 2 Score 0   ASSESSMENT: Date Discharged from Hospital:  08/12/2018 Date Medication Reconciliation Performed: 08/16/2018  Medications Discontinued at Discharge:   Valsartan/HCTZ  Meclizine  Hydrocodone/APAP  New Medications at Discharge:   Valsartan   Potassium Chloride x 7 days  Patient was recently discharged from hospital and all medications have been reviewed  Drugs sorted by system:   Cardiovascular: aspirin 81 mg, metoprolol succinate, valsartan  Pain: acetaminophen  Vitamins/Minerals: MVI, Vitamin D  PLAN: Route note to PCP, Dr. Raechel Ache.  Joetta Manners, PharmD Clinical Pharmacist [place] [phone]

## 2018-08-17 ENCOUNTER — Other Ambulatory Visit: Payer: Self-pay | Admitting: *Deleted

## 2018-08-17 NOTE — Patient Outreach (Signed)
[facility]) Care Management  08/17/2018  OMAH DEWALT Mar 05, 1936 [id number]  Care coordination - call from pt    Patient called [facility] RN CM to speak with CM after she reports she received a call from someone that was asking her for her for medicare number, follow up with fluids and diet  Patient is able to verify HIPAA  CM encouraged pt not to give her Social security number nor medicare number to someone she has not initiated a call to  CM discussed the do not call registry but she confirms one of her daughters has assisted her with placing her number on the do not call list previously She has not had a return call from her primary MD about fluid restriction but has left a voice message on 08/15/18 afternoon and has a f/u appointment on 08/20/18 at 1100 She confirms her edema is better after more elevation with sitting and lying down. She again denies being informed about any concerns with her kidneys She reports she urinated this am and the urine was dark but had been lighter during the night She had 2 Cups of regular coffee 2 packages of instant oatmeal with a tbsp milk for breakfast She discussed the family package microwave beef pot pie she plans on cooking for her and her son's dinner CM educated her on reading labels of this packaged product and she notes it has potassium at 410 mg and sodium at 83 Continues with support of her son who is staying with her and 2 daughters (one living in [place])  She reports liking propel and Gatorade. CM discussed Gatorade zero    Plan:   [facility] RN CM allowed her to ventilate her feelings and discussed the call concern, re discussed sodium intake, fluid intake and potassium intake CM discussed education materials sent to her and pending [place] RN CM services  CM discussed 1.5 L fluids options related to her preference of regular coffee, propel, jello water and Gatorade. CM discussed how many bottles of propel equalled 6 cups,  discussed 1500 mg sodium and 3500 mg potassium  [facility] RN CM inquired if she had compression hose and discussed the benefits of them [facility] RN CM called Dr Raechel Ache office with Mrs Mikula on the phone and spoke with Crystal at Dr Dorthula Perfect office to confirm her appointment for Monday 08/20/18 11 am and to inquire about the pending answer for Fluid restriction- another message was sent to Dr Sandy Salaam L. Lavina Hamman, RN, BSN, [place] Coordinator Office number [phone] Mobile number [phone]  Main [facility] number [phone] Fax number [phone]

## 2018-08-22 ENCOUNTER — Encounter: Payer: Self-pay | Admitting: *Deleted

## 2018-08-22 ENCOUNTER — Other Ambulatory Visit: Payer: Self-pay | Admitting: *Deleted

## 2018-08-22 NOTE — Patient Outreach (Signed)
[facility]) [place] Telephone Outreach PCP completes Transition of Care follow up post-hospital discharge Post-hospital discharge day # 10  08/22/2018  MCKALA PANTALEON [birth year] [id number]  Unsuccessful telephone outreach attempt to [name], 82 y/o female referred to [place] by [facility] RN telephonic CM; received routine referral on 08/16/18, after patient was screened by [facility] telephonic RN CM; patient experienced recent hospital visit September 28-29, 2019 for acute hyponatremia.  Patient has history including, but not limited to, HTN/ HLD; controlled DM- type II; CKD; breast CA; DDD (cervical); facial nerve palsy; anxiety; and (R) hearing loss.  HIPAA compliant voice mail message left for patient, requesting return call back; in my voice message, I asked patient to leave me a return voice message, as I would be participating in flu-shot clinic for the remainder of this afternoon; explained that I would re-attempt call to patient later this week if I did not hear back from her first.  Plan:  Confirmed that [place] unsuccessful patient outreach letter has previously been in mail by [facility] telephonic RN CM  Will re-attempt [place] telephone outreach later this week if I do not hear back from patient first.  Oneta Rack, RN, BSN, [place] Care Management  [phone]

## 2018-08-24 ENCOUNTER — Other Ambulatory Visit: Payer: Self-pay | Admitting: *Deleted

## 2018-08-24 ENCOUNTER — Encounter: Payer: Self-pay | Admitting: *Deleted

## 2018-08-24 NOTE — Patient Outreach (Signed)
[place] [facility]) Care Management [place] Telephone Outreach PCP completes transition of care outreach post-hospital discharge Post-hospital discharge day # 12  08/24/2018  Bianca Baker 1936/04/16 [id number]  Successful telephone outreach attempt to Bianca Baker, 82 y/o female referred to [place] by [facility] RN telephonic CM; received routine referral on 08/16/18, after patient was screened by [facility] telephonic RN CM; patient experienced recent hospital visit September 28-29, 2019 for acute hyponatremia.  Patient has history including, but not limited to, HTN/ HLD; controlled DM- type II; CKD; breast CA; DDD (cervical); facial nerve palsy; anxiety; and (R) hearing loss.  Patient was discharged home to self-care without home health services in place.  HIPAA/ identity verified with patient today, and [facility] Community CM services/ role were discussed with patient, who provides verbal consent for [facility] CCm involvement in her care post-hospital discharge.  Patient reports that she received [facility] CM packet in mail, and I encouraged her to return written consent form in postage paid envelope as soon as possible; patient agreed to do so.  Today, patient reports that she "is feeling better every day," and is "slowly regaining" strength post-hospital discharge.  Patient denies pain, new/ recent falls, and sounds to be in no obvious or apparent distress throughout phone call today.  Patient further reports:  Medications: -- Has all medicationsand takes as prescribed;denies questions/ concerns about current medications.  -- Verbalizes good general understanding of the purpose, dosing, and scheduling of medications.   -- self-manage medications and takes directly out of prescription bottles -- denies issues with swallowing medications -- patient was recently discharged from the hospital and all medications were thoroughly reviewed with patient during phone call today  Provider  appointments: -- All recent and upcoming provider appointments were reviewed with patient today; patient verbalizes plans to attend all; family to provide transportation -- reports next PCP office visit "first week of November;" states can not remember exact date at this time  Safety/ Mobility/ Falls: -- denies new/ recent falls post-hospital discharge: reports fall without injury "week or so before" she went to hospital-- states she slipped on wet surface in shower and denies serious injury/ having to go to ED -- assistive devices: none  -- general fall risks/ prevention education discussed with patient today  Social/ Community Resource needs: -- currently denies community resource needs, stating supportive family members that assist with care needs as indicated-- lives with her son; her daughter lives "right beside" her -- family provides transportation for patient to all provider appointments, errands, etc- patient no longer drives self  Self-health management of hyponatremia: -- reports that she has been following "prescribed diet" around sodium and fluid intake post-hospital discharge; states that she has reviewed all instructions and believes that she understands accurately -- denies questions around specific intake of fluid and sodium; we discussed importance of following prescribed diet to prevent electrolyte imbalance/ fluid overload -- discussed importance of promptly notifying care providers should patient experience any new concerns/ issues/ problems prior to returning to PCP; patient agrees to do so and confirms that she has the phone numbers for all care providers  Patient denies further issues, concerns, or problems today.  I provided/ confirmed that patient has my direct phone number, the main [facility] CM office phone number, and the [facility] CM 24-hour nurse advice phone number should issues arise prior to next scheduled [place] outreach by phone next week.  Patient states today  that she prefers [facility] CCM telephonic outreach, and declines  [facility] CCM in-hme visit, stating that she believes she is "doing so well," she does not need this; explained that if she changed her mind in the future, we could schedule then; patient agreeable. Encouraged patient to contact me directly if needs, questions, issues, or concerns arise prior to next scheduled outreach; patient agreed to do so.   Plan:  Patient will take medications as prescribed and will attend all scheduled provider appointments post-hospital discharge  Patient will promptly notify care providers for any new concerns/ issues/ problems that arise post-hospital discharge  Patient will continue following post-hospital recommendations around dietary fluid and sodium intake  I will make patient's PCP aware of [place] involvement in patient's care post-hospital discharge- will send barriers letter  [place] outreach to continue with scheduled phone call next week   [facility] CM Care Plan Problem One     Most Recent Value  Care Plan Problem One  High Risk for hospital readmisison, related to recent hospital visit september 28-29, 2019 for hyponatremia  Role Documenting the Problem One  Care Management [place] for Problem One  Active  [facility] Long Term Goal   Over the next 31 days, patient will not experience hospital readmission, as evidenced by patient reporting and review of EMR during [facility] RN CCM outreach  [place] Term Goal Start Date  08/24/18  Interventions for Problem One Long Term Goal  Discussed with patient current clinical condition, recent and upcoming provider appointments,  confirmed that patient has reliable transportation to provider appointments,  discussed [facility] Community CM service/ role with patient and initiated [place] program  [facility] CM Short Term Goal #1   Over the next 30 days, patient will continue following post-hospital discharge instructions around diet for hyponatremia, as  evidenced by patient reporting during [facility] RN CCM outreach  [facility] CM Short Term Goal #1 Start Date  08/24/18  Interventions for Short Term Goal #1  Discussed and reviewed with patient post-hospital discharge instructions around sodium intake/ diet for hyponatremia,  provided positive reinforcement for patient adhering to discharge instructions around diet and fluid/ sodium intake,  discussed importance of electrolyte balance in setting of recent hyponatremia requiring hospitalization  [facility] CM Short Term Goal #2   Over the next 30 days, patient will promptly notify care providers for any new concerns, issues, or problems that arise post-hospital discharge, as evidenced by patient reporting during [facility] RN CCM outreach  [facility] CM Short Term Goal #2 Start Date  08/24/18  Interventions for Short Term Goal #2  Discussed with patient the importance of promptly notifying care providers for any new problems or concerns,  confirmed that patient has phone numbers for care providers and reviewed upcoming scheduled provider visits with patient     I appreciate the opportunity to participate in [place] care,  Oneta Rack, RN, BSN, [employer] Coordinator [facility] Care Management  [phone]

## 2018-08-31 ENCOUNTER — Encounter: Payer: Self-pay | Admitting: *Deleted

## 2018-08-31 ENCOUNTER — Other Ambulatory Visit: Payer: Self-pay | Admitting: *Deleted

## 2018-08-31 NOTE — Patient Outreach (Addendum)
[place] [facility] Care Management [place] Telephone Outreach Post-hospital discharge day # 19  08/31/2018  Bianca Baker September 20, 1936 [id number]  12:15 pm: Unsuccessful telephone outreach attempt to Bianca Baker, 82 y/o female referred to [place] by [facility] RN telephonic CM; received routine referral on 08/16/18, after patient was screened by [facility] telephonic RN CM; patient experienced recent hospital visit September 28-29, 2019 for acute hyponatremia.Patient has history including, but not limited to, HTN/ HLD; controlled DM- type II; CKD; breast CA; DDD (cervical); facial nerve palsy; anxiety; and (R) hearing loss.  Patient was discharged home to self-care without home health services in place.    HIPAA compliant voice mail message left for patient, requesting return call back.  Plan:  Will place [place] CM unsuccessful patient outreach letter in mail to patient, requesting call back in writing   [place] telephone outreach again next week if I do not hear back from patient first.  Oneta Rack, RN, BSN, [employer] Coordinator [facility] Care Management  [phone]

## 2018-09-05 ENCOUNTER — Other Ambulatory Visit: Payer: Self-pay

## 2018-09-05 NOTE — Patient Outreach (Signed)
[name]) Care Management  09/05/2018  Bianca Baker 1936/06/07 [id number]   Subjective: "I am getting better. I am going back to the doctor next month".  Assessment: recent hospital visit September 28-29, 2019 for acute hyponatremia.Patient has history including, but not limited to, HTN/ HLD; controlled DM- type II; CKD; breast CA; DDD (cervical); facial nerve palsy; anxiety; and (R) hearing loss.  RNCM called to follow up. Client is without any questions or concerns. RNCM attempted to provided contact number for covering nurse. Client declines.  Plan: continue to follow. RNCM will update assigned RNCM, Reginia Naas.  Thea Silversmith, RN, MSN, [place] Coordinator Cell: [phone]

## 2018-09-10 ENCOUNTER — Encounter: Payer: Self-pay | Admitting: *Deleted

## 2018-09-10 ENCOUNTER — Other Ambulatory Visit: Payer: Self-pay | Admitting: *Deleted

## 2018-09-10 NOTE — Patient Outreach (Signed)
[place] [facility]) Care Management [place] Telephone Outreach Post-hospital discharge day # 29  09/10/2018  Bianca Baker 11/15/1935 [id number]  Successful telephone outreach attempt to [name], 82 y/o female referred to [place] by [facility] RN telephonic CM; received routine referral on 08/16/18, after patient was screened by [facility] telephonic RN CM; patient experienced recent hospital visit September 28-29, 2019 for acute hyponatremia.Patient has history including, but not limited to, HTN/ HLD; controlled DM- type II; CKD; breast CA; DDD (cervical); facial nerve palsy; anxiety; and (R) hearing loss.  Patient was discharged home to self-care without home health services in place.  HIPAA/ identity verified with patient today.  Today, patient reports that she continues "doing great, not having any problems."  Patient denies pain, new/ recent falls, and sounds to be in no obvious or apparent distress throughout phone call today.  Patient further reports:  -- no concerns/ recent changes to medications: continues taking all as prescribed, continues self-managing medications -- upcoming provider appointments next week:  PCP 09/18/18 for "monthly re-check" after recent hospitalization; eye doctor 09/19/18 for post-cataract check; continues to report that her daughter will provide transportation -- no questions/ concerns around signs/ symptoms hyponatremia- reports "following all doctor's instructions around diet and fluid intake;" states limiting fluids as directed; verbalizes plan to discuss overall plan with fluid restriction with PCP next week during scheduled office visit  -- lack of knowledge around Advanced Directive (AD) planning: states she currently does not have living will nor HCPOA- discussed basics of AD planning with patient, who agrees to review AD planning packet/ printed educational material which I will place in mail to her.  Encouraged patient to review  before our next scheduled phone call, and she agrees to do.  Patient denies further issues, concerns, or problems today. I confirmed that patient hasmy direct phone number, the main [facility] CM office phone number, and the [facility] CM 24-hour nurse advice phone number should issues arise prior to next scheduled [place] outreach by phone next week.  Patient again declines need for [facility] CCM in-home visit.  Encouraged patient to contact me directly if needs, questions, issues, or concerns arise prior to next scheduled outreach; patient agreed to do so.   Plan:  Patient will take medications as prescribed and will attend all scheduled provider appointments post-hospital discharge  Patient will promptly notify care providers for any new concerns/ issues/ problems that arise post-hospital discharge  Patient will continue following post-hospital recommendations around dietary fluid and sodium intake  I will share today's notes/ care plan with patient's PCP as initial assessment   [facility] Community CM outreach to continue with scheduled phone call next week  [facility] CM Care Plan Problem One     Most Recent Value  Care Plan Problem One  High Risk for hospital readmisison, related to recent hospital visit september 28-29, 2019 for hyponatremia  Role Documenting the Problem One  Care Management [place] for Problem One  Active  [facility] Long Term Goal   Over the next 31 days, patient will not experience hospital readmission, as evidenced by patient reporting and review of EMR during [place] outreach  [facility] Long Term Goal Start Date  08/24/18  Interventions for Problem One Long Term Goal  Discussed with patient current clinical condition and confirmed that she has transportation for upcoming scheduled provider appointments  [facility] CM Short Term Goal #1   Over the next 30 days, patient will continue following post-hospital  discharge instructions around diet for hyponatremia, as evidenced by patient  reporting during [place] outreach  [facility] CM Short Term Goal #1 Start Date  08/24/18  Interventions for Short Term Goal #1  Confirmed with patient that she has continued following diet instrucations from last PCP office visit August 14, 2018 and that she has no questions about diet/ no signs/ symptoms concerning for hyponatremia  [facility] CM Short Term Goal #2   Over the next 30 days, patient will promptly notify care providers for any new concerns, issues, or problems that arise post-hospital discharge, as evidenced by patient reporting during [facility] RN CCM outreach  [facility] CM Short Term Goal #2 Start Date  08/24/18  Interventions for Short Term Goal #2  Confirmed that patient has phone numbers for care providers,  encouraged patient to promptly notify providers for any new concerns that arise,  encouraged patient to discuss overall plan of care/ ongoing instructions for fluid management/ restriction with PCP at upcoming scheduled appointment next week    [facility] CM Care Plan Problem Two     Most Recent Value  Care Plan Problem Two  Knowledge deficit around Advanced Directive planning, as evidenced by patient reporting  Role Documenting the Problem Two  Care Management [place] for Problem Two  Active  [facility] CM Short Term Goal #1   Over the next 30 days, patient will review Advanced Directive planning packet and EMMI educational material, as evidenced by patient reporting during [facility] RN CCM outreach  [facility] CM Short Term Goal #1 Start Date  09/10/18  Interventions for Short Term Goal #2   Discussed with patient basics around Advanced Directive Planning,  mailed Advanced Directive Planning packet to patient along with printed EMMI educational material and encouraged patient to review once she receives in mail     Oneta Rack, Therapist, sports, Copywriter, advertising, [employer] Coordinator [facility] Care Management  [phone]

## 2018-09-18 DIAGNOSIS — N182 Chronic kidney disease, stage 2 (mild): Secondary | ICD-10-CM | POA: Diagnosis not present

## 2018-09-18 DIAGNOSIS — R946 Abnormal results of thyroid function studies: Secondary | ICD-10-CM | POA: Insufficient documentation

## 2018-09-18 DIAGNOSIS — M170 Bilateral primary osteoarthritis of knee: Secondary | ICD-10-CM | POA: Diagnosis not present

## 2018-09-18 DIAGNOSIS — E871 Hypo-osmolality and hyponatremia: Secondary | ICD-10-CM | POA: Diagnosis not present

## 2018-09-18 DIAGNOSIS — Z79899 Other long term (current) drug therapy: Secondary | ICD-10-CM | POA: Diagnosis not present

## 2018-09-18 DIAGNOSIS — I10 Essential (primary) hypertension: Secondary | ICD-10-CM | POA: Diagnosis not present

## 2018-09-18 DIAGNOSIS — E1122 Type 2 diabetes mellitus with diabetic chronic kidney disease: Secondary | ICD-10-CM | POA: Diagnosis not present

## 2018-09-18 DIAGNOSIS — E1159 Type 2 diabetes mellitus with other circulatory complications: Secondary | ICD-10-CM | POA: Diagnosis not present

## 2018-09-20 ENCOUNTER — Other Ambulatory Visit: Payer: Self-pay | Admitting: *Deleted

## 2018-09-20 ENCOUNTER — Encounter: Payer: Self-pay | Admitting: *Deleted

## 2018-09-20 NOTE — Patient Outreach (Addendum)
[facility]) Care Management [place] Telephone Outreach Post-hospital discharge day # 39  09/20/2018  Bianca Baker [birth year] [id number]  Successful telephone outreach attempt to Bianca Baker, 82 y/o female referred to [place] by [facility] RN telephonic CM; received routine referral on 08/16/18, after patient was screened by [facility] telephonic RN CM; patient experienced recent hospital visit September 28-29, 2019 for acute hyponatremia.Patient has history including, but not limited to, HTN/ HLD; controlled DM- type II; CKD; breast CA; DDD (cervical); facial nerve palsy; anxiety; and (R) hearing loss.Patient was discharged home to self-care without home health services in place. HIPAA/ identity verified with patient today.  Today, patient reports that she continues "doing just fine."  Patient denies pain, new/ recent falls, and sounds to be in no obvious or apparent distress throughout 30 minute phone call today.  Patient further reports:  -- attended recent PCP appointment on 09/18/18: reports BP was elevated during visit; states PCP added new BP medication (amlodipine 2.5 mg po QD), which patient obtained and just started taking today.  Medication review post- PCP office visit completed and medication list in EMR updated according to patient report today. Patient endorses that she continues taking all as prescribed, continues self-managing medications.  Noted that patient is on a total of 3 medications for HTN; this was discussed with patient and we reviewed signs/ symptoms of hypotension/ low blood pressure; including lightheadedness, dizziness, generalized malaise/ weakness: discussed action plan for hypotension, including to lie/ sit down immediately and patient was encouraged to report any of these symptoms promptly to PCP.  Patient reports that she does not regularly check her BP at home although she has a blood pressure cuff; I encouraged patient to  monitor and record her blood pressures with the addition of this new third hypertensive medication; discussed value of recording BP values at home, and encouraged her to take all recorded values to PCP for review at time of next scheduled PCP office visit next month; patient verbalizes understanding and agreement.  Confirmed that patient obtained flu shot at previous PCP appointment, "a couple of weeks ago"  -- attended eye doctor office visit  09/19/18 for post-cataract check; reports "got a good report."  -- no longer concerned about hyponatremia; reports she believes this has "resolved" after visiting with PCP this week.  -- ongoing lack of knowledge around Advanced Directive (AD) planning: confirmed that she received printed EMMI educational material/ AD planning packet previously mailed to her; states she has no questions, as she has not yet had time to read/ review; encouraged patient to do so promptly and to contact me for any questions she has; patient verbalizes agreement.  Patient denies further issues, concerns, or problems today. Iconfirmed that patient hasmy direct phone number, the main [facility] CM office phone number, and the [facility] CM 24-hour nurse advice phone number should issues arise prior to next scheduled [place] outreachby phone next month.Discussed that patient has thus far made great progress in meeting her previously established [facility] CCM goals, along with possibility of case closure with next outreach, an dpatient is agreeable tot his today.  Encouraged patient to contact me directly if needs, questions, issues, or concerns arise prior to next scheduled outreach; patient agreed to do so.  Plan:  Patient will take medications as prescribed and will attend all scheduled provider appointments post-hospital discharge  Patient will promptly notify care providers for any new concerns/ issues/ problems that arise post-hospital discharge  Patient will periodically monitor  and record her blood pressure each week and will take recorded values at home to next scheduled PCP office visit  [place] outreach to continue with scheduled phone call next month  [place] Problem One     Most Recent Value  Care Plan Problem One  High Risk for hospital readmisison, related to recent hospital visit september 28-29, 2019 for hyponatremia  Role Documenting the Problem One  Care Management [place] for Problem One  Not Active  THN Long Term Goal   Over the next 31 days, patient will not experience hospital readmission, as evidenced by patient reporting and review of EMR during [facility] RN CCM outreach  [place] Term Goal Start Date  08/24/18  [facility] Long Term Goal Met Date  09/20/18 [Goal Met]  Interventions for Problem One Long Term Goal  Confirmed that patient has not experienced hospital readmission within 31 days of her last hospital discharge  THN CM Short Term Goal #1   Over the next 30 days, patient will continue following post-hospital discharge instructions around diet for hyponatremia, as evidenced by patient reporting during [facility] RN CCM outreach  [facility] CM Short Term Goal #1 Start Date  08/24/18  [facility] CM Short Term Goal #1 Met Date  09/20/18 [Goal Met]  Interventions for Short Term Goal #1  Confirmed that patient attended recent PCP office visit where she told her sodium level was improved,  reports no longer following hyponatremia diet  THN CM Short Term Goal #2   Over the next 30 days, patient will promptly notify care providers for any new concerns, issues, or problems that arise post-hospital discharge, as evidenced by patient reporting during [facility] RN CCM outreach  [facility] CM Short Term Goal #2 Start Date  08/24/18  [facility] CM Short Term Goal #2 Met Date  09/20/18 [Goal Met]  Interventions for Short Term Goal #2  Confirmed that patient is able to verbalize reasons to contact care providers vs. seek emergent care,  confirmed that patient has had no concerns  since last [facility] CM outreach, and that she attended PCP office visit 09/18/18,  discussed changes to medications post- PCP appointment    [facility] CM Care Plan Problem Two     Most Recent Value  Care Plan Problem Two  Knowledge deficit around Advanced Directive planning, as evidenced by patient reporting  Role Documenting the Problem Two  Care Management Coordinator  Care Plan for Problem Two  Active  THN CM Short Term Goal #1   Over the next 30 days, patient will review Advanced Directive planning packet and EMMI educational material, as evidenced by patient reporting during [facility] RN CCM outreach  [facility] CM Short Term Goal #1 Start Date  09/20/18 [Goal extended today]  Interventions for Short Term Goal #2   Confirmed that patient has received printed EMMI educational material and AD planning packet in mail,  encouraged patient to review promptly and to contact me for any questions prior to next [facility] CCM RN scheduled outreach    [facility] CM Care Plan Problem Three     Most Recent Value  Care Plan Problem Three  Self-health management of chronic disease state of HTN, as evidenced by patient reporting  Role Documenting the Problem Three  Care Management Coordinator  Care Plan for Problem Three  Active  THN CM Short Term Goal #1   Over the next 30 days, patient will periodically monitor and record her blood pressure at home each week and will  take recorded BP values to scheduled PCP office visit next month  THN CM Short Term Goal #1 Start Date  09/20/18  Interventions for Short Term Goal #1  Discussed newly prescribed medicaton for HTN and confirmed that patient is taking as prescribed, along with other medications for HTN,  medication review completed and medication list updated,  signs/ symptoms low blood pressure with action plan discussed with patient,  signs/ symptoms MI and CVA with action plan discussed with patient  THN CM Short Term Goal #2   Over the next 30 days, patient will report any new concerns, issues,  or problems to PCP or appropriate care providers, as evidenced by patient reporting during [facility] RN CCM outreach  [facility] CM Short Term Goal #2 Start Date  09/20/18  Interventions for Short Term Goal #2  Discussed reasons to contact care providers vs. seek emergent care,  confirmed that patient has PCP office phone number and encouraged her to report any new signs/ symptoms low blood pressure after recently added new antihypertensive medication     Oneta Rack, RN, BSN, [employer] Coordinator [facility] Care Management  [phone]

## 2018-10-18 DIAGNOSIS — E871 Hypo-osmolality and hyponatremia: Secondary | ICD-10-CM | POA: Diagnosis not present

## 2018-10-18 DIAGNOSIS — I10 Essential (primary) hypertension: Secondary | ICD-10-CM | POA: Diagnosis not present

## 2018-10-18 DIAGNOSIS — E1159 Type 2 diabetes mellitus with other circulatory complications: Secondary | ICD-10-CM | POA: Diagnosis not present

## 2018-10-19 ENCOUNTER — Other Ambulatory Visit: Payer: Self-pay | Admitting: *Deleted

## 2018-10-19 ENCOUNTER — Encounter: Payer: Self-pay | Admitting: *Deleted

## 2018-10-19 NOTE — Patient Outreach (Signed)
[place] [facility]) Care Management [place] Telephone Outreach- Case Closure Post-hospital discharge day # 68   10/19/2018  GABRIELLAH RABEL 11/07/1936 [id number]  Successful telephone outreach attempt to [name], 82 y/o female referred to [place] by [facility] RN telephonic CM; received routine referral on 08/16/18, after patient was screened by [facility] telephonic RN CM; patient experienced recent hospital visit September 28-29, 2019 for acute hyponatremia.Patient has history including, but not limited to, HTN/ HLD; controlled DM- type II; CKD; breast CA; DDD (cervical); facial nerve palsy; anxiety; and (R) hearing loss.Patient was discharged home to self-care without home health services in place. HIPAA/ identity verified with patient today.  Today, patient reports that shecontinues "doing real good."Patient denies pain, new/ recent falls, and sounds to be in no obvious or apparent distress throughout phone call today.  Patient further reports:  -- attended recent PCP appointment on 10/18/18: reports BP continues "slightly elevated;" reviewed with patient post- PCP office visit instructions; patient confirms that she has and is taking medications as prescribed post- PCP office visit; patient is able to accurately verbalize names/ doses and scheduling of medications   -- confirms that she received and has reviewed previously provided EMMI printed educational material around signs/ symptoms of hypotension/ low blood pressure; patient denies that she has experienced these symptoms and is able to verbalize signs/ symptoms along with action plan without prompting; states that she has been occasionally monitoring her BP at home and in the community  -- confirms that she received and has reviewed Advanced Directive planning packet; denies further questions   Patient denies further issues, concerns, or problems today. We discussed that patient has successfully met  her previously established [place] CM goals, and patient agrees with [place] CM case closure today, and denies further care coordination needs.  Iconfirmed that patient hasmy direct phone number, the main [facility] CM office phone number, and the [facility] CM 24-hour nurse advice phone number should issues arise in the future.  Plan:  Will close [place] patient case, as she has successfully met her previously established goals and denies further care coordination needs, and will make patient's PCP aware of same.   [facility] CM Care Plan Problem Two     Most Recent Value  Care Plan Problem Two  Knowledge deficit around Advanced Directive planning, as evidenced by patient reporting  Role Documenting the Problem Two  Care Management [place] for Problem Two  Not Active  THN CM Short Term Goal #1   Over the next 30 days, patient will review Advanced Directive planning packet and EMMI educational material, as evidenced by patient reporting during [facility] RN CCM outreach  [facility] CM Short Term Goal #1 Start Date  09/20/18  [facility] CM Short Term Goal #1 Met Date   10/19/18 [Goal Met]  Interventions for Short Term Goal #2   Patient confirms today that she reviewed AD planning packet and denies further questions around AD planning    [facility] CM Care Plan Problem Three     Most Recent Value  Care Plan Problem Three  Self-health management of chronic disease state of HTN, as evidenced by patient reporting  Role Documenting the Problem Three  Care Management Coordinator  Care Plan for Problem Three  Not Active  THN CM Short Term Goal #1   Over the next 30 days, patient will periodically monitor and record her blood pressure at home and will take recorded BP values to scheduled PCP  office visit next month  THN CM Short Term Goal #1 Start Date  09/20/18  [facility] CM Short Term Goal #1 Met Date  10/19/18 [Goal Met]  Interventions for Short Term Goal #1  Confirmed that patient monitored blood pressure  periodically at home,  reviewed process for BP measurement and encouraged her ongoing engagement in BP monitoring/ management; reviewed recent PCP post-office visit instructions with patient  THN CM Short Term Goal #2   Over the next 30 days, patient will report any new concerns, issues, or problems to PCP or appropriate care providers, as evidenced by patient reporting during [facility] RN CCM outreach  [facility] CM Short Term Goal #2 Start Date  09/20/18  [facility] CM Short Term Goal #2 Met Date  10/19/18 [Goal Met]  Interventions for Short Term Goal #2  Confirmed that patient has not had recent concerns with clinical condition,  reviewed reasons to call care providers vs. seek emergent/ urgent care     It has been a pleasure caring for Ms. Fontaine No, RN, BSN, [employer] [facility] Care Management  [phone]

## 2018-10-31 DIAGNOSIS — Z1211 Encounter for screening for malignant neoplasm of colon: Secondary | ICD-10-CM | POA: Diagnosis not present

## 2018-10-31 DIAGNOSIS — Z124 Encounter for screening for malignant neoplasm of cervix: Secondary | ICD-10-CM | POA: Diagnosis not present

## 2018-10-31 DIAGNOSIS — B3789 Other sites of candidiasis: Secondary | ICD-10-CM | POA: Diagnosis not present

## 2018-11-09 DIAGNOSIS — Z1211 Encounter for screening for malignant neoplasm of colon: Secondary | ICD-10-CM | POA: Diagnosis not present

## 2019-04-16 ENCOUNTER — Emergency Department: Payer: Medicare Other

## 2019-04-16 ENCOUNTER — Emergency Department
Admission: EM | Admit: 2019-04-16 | Discharge: 2019-04-16 | Disposition: A | Discharge status: Home / Self Care | Payer: Medicare Other | Attending: Emergency Medicine | Admitting: Emergency Medicine

## 2019-04-16 ENCOUNTER — Other Ambulatory Visit: Payer: Self-pay

## 2019-04-16 DIAGNOSIS — Z79899 Other long term (current) drug therapy: Secondary | ICD-10-CM | POA: Insufficient documentation

## 2019-04-16 DIAGNOSIS — Z87891 Personal history of nicotine dependence: Secondary | ICD-10-CM | POA: Diagnosis not present

## 2019-04-16 DIAGNOSIS — Y999 Unspecified external cause status: Secondary | ICD-10-CM | POA: Diagnosis not present

## 2019-04-16 DIAGNOSIS — S01511A Laceration without foreign body of lip, initial encounter: Secondary | ICD-10-CM

## 2019-04-16 DIAGNOSIS — Z7982 Long term (current) use of aspirin: Secondary | ICD-10-CM | POA: Insufficient documentation

## 2019-04-16 DIAGNOSIS — W010XXA Fall on same level from slipping, tripping and stumbling without subsequent striking against object, initial encounter: Secondary | ICD-10-CM | POA: Insufficient documentation

## 2019-04-16 DIAGNOSIS — Z853 Personal history of malignant neoplasm of breast: Secondary | ICD-10-CM | POA: Diagnosis not present

## 2019-04-16 DIAGNOSIS — Y92513 Shop (commercial) as the place of occurrence of the external cause: Secondary | ICD-10-CM | POA: Insufficient documentation

## 2019-04-16 DIAGNOSIS — S8001XA Contusion of right knee, initial encounter: Secondary | ICD-10-CM | POA: Diagnosis not present

## 2019-04-16 DIAGNOSIS — S8991XA Unspecified injury of right lower leg, initial encounter: Secondary | ICD-10-CM | POA: Diagnosis present

## 2019-04-16 DIAGNOSIS — Y939 Activity, unspecified: Secondary | ICD-10-CM | POA: Diagnosis not present

## 2019-04-16 DIAGNOSIS — W19XXXA Unspecified fall, initial encounter: Secondary | ICD-10-CM

## 2019-04-16 MED ORDER — LIDOCAINE-EPINEPHRINE-TETRACAINE (LET) SOLUTION
3.0000 mL | Freq: Once | NASAL | Status: AC
  Administered 2019-04-16: 3 mL via TOPICAL
  Filled 2019-04-16: qty 3

## 2019-04-16 MED ORDER — HYDROCODONE-ACETAMINOPHEN 5-325 MG PO TABS
1.0000 | ORAL_TABLET | Freq: Once | ORAL | Status: AC
  Administered 2019-04-16: 1 via ORAL
  Filled 2019-04-16: qty 1

## 2019-04-16 MED ORDER — TRAMADOL HCL 50 MG PO TABS: ORAL_TABLET | ORAL | 0 refills | Status: DC

## 2019-04-16 NOTE — Discharge Instructions (Addendum)
Follow-up with your primary care provider if any continued problems.  Also watch the laceration to your lip for any signs of infection and avoid salty or foods containing citric acid which may cause some burning.  Both your lip and your right knee will look worse tomorrow than it does today.  Elevate your leg often and use ice packs to reduce swelling.  Wear knee immobilizer when up walking.  Tramadol every 6-8 hours if needed for pain.  Be aware that this medication could cause drowsiness and increase your risk for falling.  Return to the emergency department if any severe worsening of your symptoms or urgent concerns.

## 2019-04-16 NOTE — ED Notes (Signed)
See triage note  States she tripped and fell  Swelling and bruising noted to right knee and lower leg   Small lac noted to lip
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
## 2019-04-16 NOTE — ED Triage Notes (Signed)
Pt states she tripped and fell today while out shopping and injured her right knee and bottom lip.

## 2019-07-18 ENCOUNTER — Other Ambulatory Visit: Payer: Self-pay | Admitting: Internal Medicine

## 2019-07-18 DIAGNOSIS — Z853 Personal history of malignant neoplasm of breast: Secondary | ICD-10-CM

## 2019-07-18 DIAGNOSIS — Z1231 Encounter for screening mammogram for malignant neoplasm of breast: Secondary | ICD-10-CM

## 2019-07-26 ENCOUNTER — Emergency Department: Payer: Medicare Other

## 2019-07-26 ENCOUNTER — Emergency Department
Admission: EM | Admit: 2019-07-26 | Discharge: 2019-07-26 | Disposition: A | Discharge status: Home / Self Care | Payer: Medicare Other | Attending: Emergency Medicine | Admitting: Emergency Medicine

## 2019-07-26 ENCOUNTER — Other Ambulatory Visit: Payer: Self-pay

## 2019-07-26 ENCOUNTER — Encounter: Payer: Self-pay | Admitting: Emergency Medicine

## 2019-07-26 DIAGNOSIS — Z87891 Personal history of nicotine dependence: Secondary | ICD-10-CM | POA: Diagnosis not present

## 2019-07-26 DIAGNOSIS — Z9221 Personal history of antineoplastic chemotherapy: Secondary | ICD-10-CM | POA: Diagnosis not present

## 2019-07-26 DIAGNOSIS — Z96652 Presence of left artificial knee joint: Secondary | ICD-10-CM | POA: Insufficient documentation

## 2019-07-26 DIAGNOSIS — Z79899 Other long term (current) drug therapy: Secondary | ICD-10-CM | POA: Insufficient documentation

## 2019-07-26 DIAGNOSIS — Z7982 Long term (current) use of aspirin: Secondary | ICD-10-CM | POA: Insufficient documentation

## 2019-07-26 DIAGNOSIS — N183 Chronic kidney disease, stage 3 (moderate): Secondary | ICD-10-CM | POA: Insufficient documentation

## 2019-07-26 DIAGNOSIS — E1122 Type 2 diabetes mellitus with diabetic chronic kidney disease: Secondary | ICD-10-CM | POA: Diagnosis not present

## 2019-07-26 DIAGNOSIS — R200 Anesthesia of skin: Secondary | ICD-10-CM | POA: Insufficient documentation

## 2019-07-26 DIAGNOSIS — Z923 Personal history of irradiation: Secondary | ICD-10-CM | POA: Insufficient documentation

## 2019-07-26 DIAGNOSIS — Z853 Personal history of malignant neoplasm of breast: Secondary | ICD-10-CM | POA: Insufficient documentation

## 2019-07-26 DIAGNOSIS — I129 Hypertensive chronic kidney disease with stage 1 through stage 4 chronic kidney disease, or unspecified chronic kidney disease: Secondary | ICD-10-CM | POA: Insufficient documentation

## 2019-07-26 LAB — COMPREHENSIVE METABOLIC PANEL
ALT: 11 U/L (ref 0–44)
AST: 19 U/L (ref 15–41)
Albumin: 4.1 g/dL (ref 3.5–5.0)
Alkaline Phosphatase: 105 U/L (ref 38–126)
Anion gap: 11 (ref 5–15)
BUN: 16 mg/dL (ref 8–23)
CO2: 26 mmol/L (ref 22–32)
Calcium: 9.7 mg/dL (ref 8.9–10.3)
Chloride: 102 mmol/L (ref 98–111)
Creatinine, Ser: 0.93 mg/dL (ref 0.44–1.00)
GFR calc Af Amer: >60 mL/min (ref >60)
GFR calc non Af Amer: 58 mL/min — ABNORMAL LOW (ref >60)
Glucose, Bld: 115 mg/dL — ABNORMAL HIGH (ref 70–99)
Potassium: 4.1 mmol/L (ref 3.5–5.1)
Sodium: 139 mmol/L (ref 135–145)
Total Bilirubin: 0.6 mg/dL (ref 0.3–1.2)
Total Protein: 7.8 g/dL (ref 6.5–8.1)

## 2019-07-26 LAB — CBC WITH DIFFERENTIAL/PLATELET
Abs Immature Granulocytes: 0.03 10*3/uL (ref 0.00–0.07)
Basophils Absolute: 0.1 10*3/uL (ref 0.0–0.1)
Basophils Relative: 1 %
Eosinophils Absolute: 0.1 10*3/uL (ref 0.0–0.5)
Eosinophils Relative: 1 %
HCT: 44.3 % (ref 36.0–46.0)
Hemoglobin: 14.6 g/dL (ref 12.0–15.0)
Immature Granulocytes: 0 %
Lymphocytes Relative: 25 %
Lymphs Abs: 2 10*3/uL (ref 0.7–4.0)
MCH: 28.5 pg (ref 26.0–34.0)
MCHC: 33 g/dL (ref 30.0–36.0)
MCV: 86.5 fL (ref 80.0–100.0)
Monocytes Absolute: 0.6 10*3/uL (ref 0.1–1.0)
Monocytes Relative: 8 %
Neutro Abs: 4.9 10*3/uL (ref 1.7–7.7)
Neutrophils Relative %: 65 %
Platelets: 291 10*3/uL (ref 150–400)
RBC: 5.12 MIL/uL — ABNORMAL HIGH (ref 3.87–5.11)
RDW: 13.5 % (ref 11.5–15.5)
WBC: 7.7 10*3/uL (ref 4.0–10.5)
nRBC: 0 % (ref 0.0–0.2)

## 2019-07-26 LAB — TROPONIN I (HIGH SENSITIVITY): Troponin I (High Sensitivity): 5 ng/L (ref <18)

## 2019-07-26 LAB — URINALYSIS, COMPLETE (UACMP) WITH MICROSCOPIC
Bacteria, UA: NONE SEEN
Bilirubin Urine: NEGATIVE
Glucose, UA: NEGATIVE mg/dL
Hgb urine dipstick: NEGATIVE
Ketones, ur: NEGATIVE mg/dL
Nitrite: NEGATIVE
Protein, ur: NEGATIVE mg/dL
Specific Gravity, Urine: 1.01 (ref 1.005–1.030)
pH: 7 (ref 5.0–8.0)

## 2019-07-26 LAB — PROTIME-INR
INR: 0.9 (ref 0.8–1.2)
Prothrombin Time: 12.2 seconds (ref 11.4–15.2)

## 2019-07-26 LAB — GLUCOSE, CAPILLARY: Glucose-Capillary: 101 mg/dL — ABNORMAL HIGH (ref 70–99)

## 2019-07-26 LAB — APTT: aPTT: 28 seconds (ref 24–36)

## 2019-07-26 NOTE — Discharge Instructions (Addendum)
Please follow-up with your primary care doctor regarding today's ER visit for numbness of your face.  Your MRI as well as lab work today has resulted normal.  Return to the emergency department

## 2019-07-26 NOTE — ED Notes (Signed)
Pt on phone with MRI to answer screening questions
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
## 2019-07-26 NOTE — ED Notes (Signed)
Pt up to toilet. Steady gait. Denies any dizziness, lightheadedness, or nausea

## 2019-07-26 NOTE — ED Notes (Signed)
Pt transported to MRI at this time 

## 2019-07-26 NOTE — ED Triage Notes (Signed)
Patient ambulatory to triage with steady gait, without difficulty or distress noted, mask in place; pt reports that she awoke this morning with numbness to left side of face--denies any other c/o or accomp symptoms, denies hx of same; pt with steady gait, MAEW, grips = & strong, speech clear; rt facial droop noted which pt reports is from a previous injury

## 2019-08-19 ENCOUNTER — Ambulatory Visit: Payer: Medicare Other

## 2019-09-17 ENCOUNTER — Ambulatory Visit
Admission: RE | Admit: 2019-09-17 | Discharge: 2019-09-17 | Disposition: A | Discharge status: Home / Self Care | Payer: Medicare Other | Source: Ambulatory Visit | Attending: Internal Medicine | Admitting: Internal Medicine

## 2019-09-17 ENCOUNTER — Other Ambulatory Visit: Payer: Self-pay

## 2019-09-17 DIAGNOSIS — Z853 Personal history of malignant neoplasm of breast: Secondary | ICD-10-CM | POA: Insufficient documentation

## 2019-09-17 DIAGNOSIS — Z1231 Encounter for screening mammogram for malignant neoplasm of breast: Secondary | ICD-10-CM | POA: Insufficient documentation

## 2019-10-05 ENCOUNTER — Encounter: Payer: Self-pay | Admitting: Intensive Care

## 2019-10-05 ENCOUNTER — Emergency Department: Payer: Medicare Other | Admitting: Anesthesiology

## 2019-10-05 ENCOUNTER — Other Ambulatory Visit: Payer: Self-pay

## 2019-10-05 ENCOUNTER — Ambulatory Visit
Admission: EM | Admit: 2019-10-05 | Discharge: 2019-10-05 | Disposition: A | Discharge status: Home / Self Care | Payer: Medicare Other | Attending: Emergency Medicine | Admitting: Emergency Medicine

## 2019-10-05 ENCOUNTER — Encounter
Admission: EM | Disposition: A | Discharge status: Home / Self Care | Payer: Self-pay | Source: Home / Self Care | Attending: Emergency Medicine

## 2019-10-05 DIAGNOSIS — Z7982 Long term (current) use of aspirin: Secondary | ICD-10-CM | POA: Insufficient documentation

## 2019-10-05 DIAGNOSIS — R1310 Dysphagia, unspecified: Secondary | ICD-10-CM

## 2019-10-05 DIAGNOSIS — Z96652 Presence of left artificial knee joint: Secondary | ICD-10-CM | POA: Insufficient documentation

## 2019-10-05 DIAGNOSIS — E785 Hyperlipidemia, unspecified: Secondary | ICD-10-CM | POA: Diagnosis not present

## 2019-10-05 DIAGNOSIS — W44F3XA Food entering into or through a natural orifice, initial encounter: Secondary | ICD-10-CM

## 2019-10-05 DIAGNOSIS — Z79899 Other long term (current) drug therapy: Secondary | ICD-10-CM | POA: Diagnosis not present

## 2019-10-05 DIAGNOSIS — Z853 Personal history of malignant neoplasm of breast: Secondary | ICD-10-CM | POA: Insufficient documentation

## 2019-10-05 DIAGNOSIS — Z923 Personal history of irradiation: Secondary | ICD-10-CM | POA: Insufficient documentation

## 2019-10-05 DIAGNOSIS — T18128A Food in esophagus causing other injury, initial encounter: Secondary | ICD-10-CM | POA: Diagnosis not present

## 2019-10-05 DIAGNOSIS — Z9221 Personal history of antineoplastic chemotherapy: Secondary | ICD-10-CM | POA: Insufficient documentation

## 2019-10-05 DIAGNOSIS — N183 Chronic kidney disease, stage 3 unspecified: Secondary | ICD-10-CM | POA: Diagnosis not present

## 2019-10-05 DIAGNOSIS — Z20828 Contact with and (suspected) exposure to other viral communicable diseases: Secondary | ICD-10-CM | POA: Insufficient documentation

## 2019-10-05 DIAGNOSIS — M479 Spondylosis, unspecified: Secondary | ICD-10-CM | POA: Diagnosis not present

## 2019-10-05 DIAGNOSIS — I129 Hypertensive chronic kidney disease with stage 1 through stage 4 chronic kidney disease, or unspecified chronic kidney disease: Secondary | ICD-10-CM | POA: Insufficient documentation

## 2019-10-05 DIAGNOSIS — K222 Esophageal obstruction: Secondary | ICD-10-CM | POA: Diagnosis not present

## 2019-10-05 DIAGNOSIS — E1122 Type 2 diabetes mellitus with diabetic chronic kidney disease: Secondary | ICD-10-CM | POA: Insufficient documentation

## 2019-10-05 DIAGNOSIS — X58XXXA Exposure to other specified factors, initial encounter: Secondary | ICD-10-CM | POA: Insufficient documentation

## 2019-10-05 DIAGNOSIS — Z87891 Personal history of nicotine dependence: Secondary | ICD-10-CM | POA: Insufficient documentation

## 2019-10-05 HISTORY — PX: ESOPHAGOGASTRODUODENOSCOPY: SHX5428

## 2019-10-05 LAB — CBC WITH DIFFERENTIAL/PLATELET
Abs Immature Granulocytes: 0.05 10*3/uL (ref 0.00–0.07)
Basophils Absolute: 0.1 10*3/uL (ref 0.0–0.1)
Basophils Relative: 1 %
Eosinophils Absolute: 0.1 10*3/uL (ref 0.0–0.5)
Eosinophils Relative: 1 %
HCT: 40.5 % (ref 36.0–46.0)
Hemoglobin: 13.5 g/dL (ref 12.0–15.0)
Immature Granulocytes: 0 %
Lymphocytes Relative: 19 %
Lymphs Abs: 2.4 10*3/uL (ref 0.7–4.0)
MCH: 28.2 pg (ref 26.0–34.0)
MCHC: 33.3 g/dL (ref 30.0–36.0)
MCV: 84.7 fL (ref 80.0–100.0)
Monocytes Absolute: 0.6 10*3/uL (ref 0.1–1.0)
Monocytes Relative: 5 %
Neutro Abs: 9.6 10*3/uL — ABNORMAL HIGH (ref 1.7–7.7)
Neutrophils Relative %: 74 %
Platelets: 332 10*3/uL (ref 150–400)
RBC: 4.78 MIL/uL (ref 3.87–5.11)
RDW: 14.1 % (ref 11.5–15.5)
WBC: 12.8 10*3/uL — ABNORMAL HIGH (ref 4.0–10.5)
nRBC: 0 % (ref 0.0–0.2)

## 2019-10-05 LAB — COMPREHENSIVE METABOLIC PANEL
ALT: 10 U/L (ref 0–44)
AST: 28 U/L (ref 15–41)
Albumin: 4.2 g/dL (ref 3.5–5.0)
Alkaline Phosphatase: 104 U/L (ref 38–126)
Anion gap: 11 (ref 5–15)
BUN: 16 mg/dL (ref 8–23)
CO2: 25 mmol/L (ref 22–32)
Calcium: 9.4 mg/dL (ref 8.9–10.3)
Chloride: 101 mmol/L (ref 98–111)
Creatinine, Ser: 1.01 mg/dL — ABNORMAL HIGH (ref 0.44–1.00)
GFR calc Af Amer: >60 mL/min (ref >60)
GFR calc non Af Amer: 52 mL/min — ABNORMAL LOW (ref >60)
Glucose, Bld: 121 mg/dL — ABNORMAL HIGH (ref 70–99)
Potassium: 4.4 mmol/L (ref 3.5–5.1)
Sodium: 137 mmol/L (ref 135–145)
Total Bilirubin: 0.9 mg/dL (ref 0.3–1.2)
Total Protein: 8.3 g/dL — ABNORMAL HIGH (ref 6.5–8.1)

## 2019-10-05 LAB — SARS CORONAVIRUS 2 BY RT PCR (HOSPITAL ORDER, PERFORMED IN ~~LOC~~ HOSPITAL LAB): SARS Coronavirus 2: NEGATIVE

## 2019-10-05 SURGERY — EGD (ESOPHAGOGASTRODUODENOSCOPY)
Anesthesia: General

## 2019-10-05 MED ORDER — PROPOFOL 10 MG/ML IV BOLUS
INTRAVENOUS | Status: AC
  Filled 2019-10-05: qty 20

## 2019-10-05 MED ORDER — SUCCINYLCHOLINE CHLORIDE 20 MG/ML IJ SOLN
INTRAMUSCULAR | Status: AC
  Filled 2019-10-05: qty 1

## 2019-10-05 MED ORDER — PROPOFOL 10 MG/ML IV BOLUS
INTRAVENOUS | Status: DC | PRN
  Administered 2019-10-05: 100 mg via INTRAVENOUS

## 2019-10-05 MED ORDER — GLUCAGON HCL (RDNA) 1 MG IJ SOLR
1.0000 mg | Freq: Once | INTRAMUSCULAR | Status: AC
  Administered 2019-10-05: 16:00:00 1 mg via INTRAVENOUS
  Filled 2019-10-05 (×2): qty 1

## 2019-10-05 MED ORDER — OXYCODONE HCL 5 MG PO TABS
5.0000 mg | ORAL_TABLET | Freq: Once | ORAL | Status: DC | PRN
  Filled 2019-10-05: qty 1

## 2019-10-05 MED ORDER — OXYCODONE HCL 5 MG/5ML PO SOLN
5.0000 mg | Freq: Once | ORAL | Status: DC | PRN
  Filled 2019-10-05: qty 5

## 2019-10-05 MED ORDER — FENTANYL CITRATE (PF) 100 MCG/2ML IJ SOLN: 25.0000 ug | INTRAMUSCULAR | Status: DC | PRN

## 2019-10-05 MED ORDER — SUCCINYLCHOLINE CHLORIDE 20 MG/ML IJ SOLN
INTRAMUSCULAR | Status: DC | PRN
  Administered 2019-10-05: 100 mg via INTRAVENOUS

## 2019-10-05 MED ORDER — LACTATED RINGERS IV SOLN
INTRAVENOUS | Status: DC | PRN
  Administered 2019-10-05: 18:00:00 via INTRAVENOUS

## 2019-10-05 NOTE — Anesthesia Post-op Follow-up Note (Signed)
Anesthesia QCDR form completed.        
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
## 2019-10-05 NOTE — Op Note (Signed)
[facility] Gastroenterology Patient Name: Bianca Baker Procedure Date: 10/05/2019 4:52 PM MRN: [id number] Account #: [id number] Date of Birth: 06/04/1936 Admit Type: Outpatient Age: 83 Room: [facility] ENDO ROOM 4 Gender: Female Note Status: Finalized Procedure:             Upper GI endoscopy Indications:           Dysphagia, Foreign body in the esophagus Providers:             Lin Landsman MD, MD Referring MD:          No Local Md, MD (Referring MD) Medicines:             Monitored Anesthesia Care Complications:         No immediate complications. Estimated blood loss: None. Procedure:             Pre-Anesthesia Assessment:                        - Prior to the procedure, a History and Physical was                         performed, and patient medications and allergies were                         reviewed. The patient is competent. The risks and                         benefits of the procedure and the sedation options and                         risks were discussed with the patient. All questions                         were answered and informed consent was obtained.                         Patient identification and proposed procedure were                         verified by the physician, the nurse, the                         anesthesiologist, the anesthetist and the technician                         in the pre-procedure area in the procedure room in the                         endoscopy suite. Mental Status Examination: alert and                         oriented. Airway Examination: normal oropharyngeal                         airway and neck mobility. Respiratory Examination:                         clear to auscultation. CV Examination: normal.  Prophylactic Antibiotics: The patient does not require                         prophylactic antibiotics. Prior Anticoagulants: The                         patient has taken no  previous anticoagulant or                         antiplatelet agents. ASA Grade Assessment: III - A                         patient with severe systemic disease. After reviewing                         the risks and benefits, the patient was deemed in                         satisfactory condition to undergo the procedure. The                         anesthesia plan was to use monitored anesthesia care                         (MAC). Immediately prior to administration of                         medications, the patient was re-assessed for adequacy                         to receive sedatives. The heart rate, respiratory                         rate, oxygen saturations, blood pressure, adequacy of                         pulmonary ventilation, and response to care were                         monitored throughout the procedure. The physical                         status of the patient was re-assessed after the                         procedure.                        After obtaining informed consent, the endoscope was                         passed under direct vision. Throughout the procedure,                         the patient's blood pressure, pulse, and oxygen                         saturations were monitored continuously. The Endoscope  was introduced through the mouth, and advanced to the                         second part of duodenum. The upper GI endoscopy was                         accomplished without difficulty. The patient tolerated                         the procedure well. Findings:      Food was found in the lower third of the esophagus. Removal of food was       accomplished. Estimated blood loss: none.      The entire examined stomach was normal.      The cardia and gastric fundus were normal on retroflexion.      The duodenal bulb and second portion of the duodenum were normal.      The gastroesophageal junction and examined esophagus were  normal. Impression:            - Food in the lower third of the esophagus. Removal                         was successful.                        - Normal stomach.                        - Normal duodenal bulb and second portion of the                         duodenum.                        - Normal gastroesophageal junction and esophagus. Recommendation:        - Discharge patient to home (with escort).                        - Chopped diet and mechanical soft diet for the rest                         of the patient's life.                        - Continue present medications.                        - Use Prilosec OTC 20 mg PO daily for 4 weeks. Procedure Code(s):     --- Professional ---                        [phone], Esophagogastroduodenoscopy, flexible,                         transoral; with removal of foreign body(s) Diagnosis Code(s):     --- Professional ---                        R15.400Q, Food in esophagus causing other injury,  initial encounter                        R13.10, Dysphagia, unspecified                        T18.108A, Unspecified foreign body in esophagus                         causing other injury, initial encounter CPT copyright 2019 American Medical Association. All rights reserved. The codes documented in this report are preliminary and upon coder review may  be revised to meet current compliance requirements. Dr. Ulyess Mort Lin Landsman MD, MD 10/05/2019 6:10:26 PM This report has been signed electronically. Number of Addenda: 0 Note Initiated On: 10/05/2019 4:52 PM Estimated Blood Loss:  Estimated blood loss: none.      [facility]

## 2019-10-05 NOTE — ED Triage Notes (Signed)
Patient reports she was eating beef stew today and a piece got stuck and she had to throw it up. Reports the piece of beef came up but now she is experiencing difficulty swallowing. She reports she is unable to even swallow water. Reports a history of trouble swallowing but has never had a procedure to help with swallowing. Speech clear and able to talk in complete sentences. Unlabored breathing in triage.

## 2019-10-05 NOTE — Transfer of Care (Signed)
Immediate Anesthesia Transfer of Care Note  Patient: Bianca Baker  Procedure(s) Performed: ESOPHAGOGASTRODUODENOSCOPY (EGD) (N/A )  Patient Location: PACU  Anesthesia Type:General  Level of Consciousness: awake  Airway & Oxygen Therapy: Patient Spontanous Breathing  Post-op Assessment: Report given to RN  Post vital signs: stable  Last Vitals:  Vitals Value Taken Time  BP 123/68 10/05/19 1818  Temp 36.4 C 10/05/19 1818  Pulse 80 10/05/19 1820  Resp 21 10/05/19 1820  SpO2 100 % 10/05/19 1820  Vitals shown include unvalidated device data.  Last Pain:  Vitals:   10/05/19 1818  TempSrc:   PainSc: 0-No pain         Complications: No apparent anesthesia complications
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
## 2019-10-05 NOTE — Anesthesia Procedure Notes (Signed)
Procedure Name: Intubation Date/Time: 10/05/2019 5:54 PM Performed by: Orion Crook, CRNA Pre-anesthesia Checklist: Patient identified, Emergency Drugs available, Suction available, Patient being monitored and Timeout performed Patient Re-evaluated:Patient Re-evaluated prior to induction Oxygen Delivery Method: Circle system utilized Preoxygenation: Pre-oxygenation with 100% oxygen Induction Type: IV induction, Cricoid Pressure applied and Rapid sequence Laryngoscope Size: 3 and McGraph Grade View: Grade I Tube type: Oral Number of attempts: 1

## 2019-10-06 NOTE — Anesthesia Postprocedure Evaluation (Signed)
Anesthesia Post Note  Patient: Bianca Baker  Procedure(s) Performed: ESOPHAGOGASTRODUODENOSCOPY (EGD) (N/A )  Patient location during evaluation: PACU Anesthesia Type: General Level of consciousness: awake and alert Pain management: pain level controlled Vital Signs Assessment: post-procedure vital signs reviewed and stable Respiratory status: spontaneous breathing, nonlabored ventilation, respiratory function stable and patient connected to nasal cannula oxygen Cardiovascular status: blood pressure returned to baseline and stable Postop Assessment: no apparent nausea or vomiting Anesthetic complications: no     Last Vitals:  Vitals:   10/05/19 1841 10/05/19 1851  BP: 134/62 (!) 149/72  Pulse: 71 71  Resp: 16 16  Temp:    SpO2: 97% 97%    Last Pain:  Vitals:   10/05/19 1851  TempSrc:   PainSc: 0-No pain                 Precious Haws Piscitello

## 2019-10-07 ENCOUNTER — Encounter: Payer: Self-pay | Admitting: Gastroenterology

## 2020-02-24 ENCOUNTER — Encounter: Payer: Self-pay | Admitting: Emergency Medicine

## 2020-02-24 ENCOUNTER — Emergency Department: Payer: Medicare Other

## 2020-02-24 ENCOUNTER — Emergency Department
Admission: EM | Admit: 2020-02-24 | Discharge: 2020-02-24 | Disposition: A | Discharge status: Home / Self Care | Payer: Medicare Other | Attending: Emergency Medicine | Admitting: Emergency Medicine

## 2020-02-24 ENCOUNTER — Other Ambulatory Visit: Payer: Self-pay

## 2020-02-24 DIAGNOSIS — E119 Type 2 diabetes mellitus without complications: Secondary | ICD-10-CM | POA: Insufficient documentation

## 2020-02-24 DIAGNOSIS — R200 Anesthesia of skin: Secondary | ICD-10-CM | POA: Insufficient documentation

## 2020-02-24 DIAGNOSIS — Z853 Personal history of malignant neoplasm of breast: Secondary | ICD-10-CM | POA: Diagnosis not present

## 2020-02-24 DIAGNOSIS — R202 Paresthesia of skin: Secondary | ICD-10-CM

## 2020-02-24 DIAGNOSIS — Z79899 Other long term (current) drug therapy: Secondary | ICD-10-CM | POA: Diagnosis not present

## 2020-02-24 DIAGNOSIS — N183 Chronic kidney disease, stage 3 unspecified: Secondary | ICD-10-CM | POA: Diagnosis not present

## 2020-02-24 DIAGNOSIS — Z7982 Long term (current) use of aspirin: Secondary | ICD-10-CM | POA: Insufficient documentation

## 2020-02-24 DIAGNOSIS — I129 Hypertensive chronic kidney disease with stage 1 through stage 4 chronic kidney disease, or unspecified chronic kidney disease: Secondary | ICD-10-CM | POA: Diagnosis not present

## 2020-02-24 LAB — DIFFERENTIAL
Abs Immature Granulocytes: 0.08 10*3/uL — ABNORMAL HIGH (ref 0.00–0.07)
Basophils Absolute: 0.1 10*3/uL (ref 0.0–0.1)
Basophils Relative: 1 %
Eosinophils Absolute: 0.1 10*3/uL (ref 0.0–0.5)
Eosinophils Relative: 1 %
Immature Granulocytes: 1 %
Lymphocytes Relative: 32 %
Lymphs Abs: 3.4 10*3/uL (ref 0.7–4.0)
Monocytes Absolute: 0.6 10*3/uL (ref 0.1–1.0)
Monocytes Relative: 5 %
Neutro Abs: 6.5 10*3/uL (ref 1.7–7.7)
Neutrophils Relative %: 60 %

## 2020-02-24 LAB — CBC
HCT: 46 % (ref 36.0–46.0)
Hemoglobin: 15.4 g/dL — ABNORMAL HIGH (ref 12.0–15.0)
MCH: 28.6 pg (ref 26.0–34.0)
MCHC: 33.5 g/dL (ref 30.0–36.0)
MCV: 85.3 fL (ref 80.0–100.0)
Platelets: 328 10*3/uL (ref 150–400)
RBC: 5.39 MIL/uL — ABNORMAL HIGH (ref 3.87–5.11)
RDW: 14.4 % (ref 11.5–15.5)
WBC: 10.7 10*3/uL — ABNORMAL HIGH (ref 4.0–10.5)
nRBC: 0 % (ref 0.0–0.2)

## 2020-02-24 LAB — COMPREHENSIVE METABOLIC PANEL
ALT: 13 U/L (ref 0–44)
AST: 19 U/L (ref 15–41)
Albumin: 4.4 g/dL (ref 3.5–5.0)
Alkaline Phosphatase: 110 U/L (ref 38–126)
Anion gap: 11 (ref 5–15)
BUN: 16 mg/dL (ref 8–23)
CO2: 26 mmol/L (ref 22–32)
Calcium: 10 mg/dL (ref 8.9–10.3)
Chloride: 101 mmol/L (ref 98–111)
Creatinine, Ser: 0.76 mg/dL (ref 0.44–1.00)
GFR calc Af Amer: >60 mL/min (ref >60)
GFR calc non Af Amer: >60 mL/min (ref >60)
Glucose, Bld: 99 mg/dL (ref 70–99)
Potassium: 3.9 mmol/L (ref 3.5–5.1)
Sodium: 138 mmol/L (ref 135–145)
Total Bilirubin: 0.7 mg/dL (ref 0.3–1.2)
Total Protein: 8 g/dL (ref 6.5–8.1)

## 2020-02-24 LAB — APTT: aPTT: 28 seconds (ref 24–36)

## 2020-02-24 LAB — PROTIME-INR
INR: 0.9 (ref 0.8–1.2)
Prothrombin Time: 11.7 seconds (ref 11.4–15.2)

## 2020-02-24 NOTE — ED Notes (Signed)
Physical copy of dc paperwork signed and sent to records.
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
## 2020-03-06 ENCOUNTER — Other Ambulatory Visit: Payer: Self-pay | Admitting: Acute Care

## 2020-03-06 DIAGNOSIS — I639 Cerebral infarction, unspecified: Secondary | ICD-10-CM

## 2020-03-23 ENCOUNTER — Ambulatory Visit
Admission: RE | Admit: 2020-03-23 | Discharge: 2020-03-23 | Disposition: A | Discharge status: Home / Self Care | Payer: Medicare Other | Source: Ambulatory Visit | Attending: Acute Care | Admitting: Acute Care

## 2020-03-23 ENCOUNTER — Other Ambulatory Visit: Payer: Self-pay

## 2020-03-23 DIAGNOSIS — I639 Cerebral infarction, unspecified: Secondary | ICD-10-CM | POA: Diagnosis not present

## 2020-07-23 ENCOUNTER — Other Ambulatory Visit: Payer: Self-pay | Admitting: Internal Medicine

## 2020-07-23 DIAGNOSIS — Z1231 Encounter for screening mammogram for malignant neoplasm of breast: Secondary | ICD-10-CM

## 2020-08-18 DIAGNOSIS — Z20822 Contact with and (suspected) exposure to covid-19: Secondary | ICD-10-CM | POA: Insufficient documentation

## 2020-08-18 DIAGNOSIS — Z9221 Personal history of antineoplastic chemotherapy: Secondary | ICD-10-CM | POA: Insufficient documentation

## 2020-08-18 DIAGNOSIS — I129 Hypertensive chronic kidney disease with stage 1 through stage 4 chronic kidney disease, or unspecified chronic kidney disease: Secondary | ICD-10-CM | POA: Insufficient documentation

## 2020-08-18 DIAGNOSIS — E1122 Type 2 diabetes mellitus with diabetic chronic kidney disease: Secondary | ICD-10-CM | POA: Diagnosis not present

## 2020-08-18 DIAGNOSIS — Z7982 Long term (current) use of aspirin: Secondary | ICD-10-CM | POA: Insufficient documentation

## 2020-08-18 DIAGNOSIS — Z79899 Other long term (current) drug therapy: Secondary | ICD-10-CM | POA: Diagnosis not present

## 2020-08-18 DIAGNOSIS — Z853 Personal history of malignant neoplasm of breast: Secondary | ICD-10-CM | POA: Insufficient documentation

## 2020-08-18 DIAGNOSIS — N183 Chronic kidney disease, stage 3 unspecified: Secondary | ICD-10-CM | POA: Diagnosis not present

## 2020-08-18 DIAGNOSIS — R202 Paresthesia of skin: Secondary | ICD-10-CM | POA: Diagnosis present

## 2020-08-18 DIAGNOSIS — Z87891 Personal history of nicotine dependence: Secondary | ICD-10-CM | POA: Insufficient documentation

## 2020-08-18 DIAGNOSIS — Z96652 Presence of left artificial knee joint: Secondary | ICD-10-CM | POA: Diagnosis not present

## 2020-08-18 DIAGNOSIS — G459 Transient cerebral ischemic attack, unspecified: Principal | ICD-10-CM | POA: Insufficient documentation
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
## 2020-08-19 ENCOUNTER — Emergency Department: Payer: Medicare Other

## 2020-08-19 ENCOUNTER — Observation Stay: Payer: Medicare Other

## 2020-08-19 ENCOUNTER — Other Ambulatory Visit: Payer: Self-pay

## 2020-08-19 ENCOUNTER — Observation Stay
Admission: EM | Admit: 2020-08-19 | Discharge: 2020-08-19 | Disposition: A | Discharge status: Home / Self Care | Payer: Medicare Other | Attending: Internal Medicine | Admitting: Internal Medicine

## 2020-08-19 ENCOUNTER — Observation Stay
Admit: 2020-08-19 | Discharge: 2020-08-19 | Disposition: A | Discharge status: Home / Self Care | Payer: Medicare Other | Attending: Internal Medicine | Admitting: Internal Medicine

## 2020-08-19 DIAGNOSIS — Z8673 Personal history of transient ischemic attack (TIA), and cerebral infarction without residual deficits: Secondary | ICD-10-CM

## 2020-08-19 DIAGNOSIS — I639 Cerebral infarction, unspecified: Secondary | ICD-10-CM

## 2020-08-19 DIAGNOSIS — G510 Bell's palsy: Secondary | ICD-10-CM

## 2020-08-19 DIAGNOSIS — G459 Transient cerebral ischemic attack, unspecified: Secondary | ICD-10-CM | POA: Diagnosis not present

## 2020-08-19 DIAGNOSIS — E118 Type 2 diabetes mellitus with unspecified complications: Secondary | ICD-10-CM

## 2020-08-19 DIAGNOSIS — E1159 Type 2 diabetes mellitus with other circulatory complications: Secondary | ICD-10-CM | POA: Diagnosis present

## 2020-08-19 DIAGNOSIS — I10 Essential (primary) hypertension: Secondary | ICD-10-CM | POA: Diagnosis present

## 2020-08-19 DIAGNOSIS — I152 Hypertension secondary to endocrine disorders: Secondary | ICD-10-CM | POA: Diagnosis present

## 2020-08-19 LAB — DIFFERENTIAL
Abs Immature Granulocytes: 0.07 10*3/uL (ref 0.00–0.07)
Basophils Absolute: 0.1 10*3/uL (ref 0.0–0.1)
Basophils Relative: 1 %
Eosinophils Absolute: 0.1 10*3/uL (ref 0.0–0.5)
Eosinophils Relative: 1 %
Immature Granulocytes: 1 %
Lymphocytes Relative: 31 %
Lymphs Abs: 3 10*3/uL (ref 0.7–4.0)
Monocytes Absolute: 0.6 10*3/uL (ref 0.1–1.0)
Monocytes Relative: 6 %
Neutro Abs: 5.8 10*3/uL (ref 1.7–7.7)
Neutrophils Relative %: 60 %

## 2020-08-19 LAB — HEMOGLOBIN A1C
Hgb A1c MFr Bld: 5.7 % — ABNORMAL HIGH (ref 4.8–5.6)
Mean Plasma Glucose: 116.89 mg/dL

## 2020-08-19 LAB — CBC
HCT: 38.7 % (ref 36.0–46.0)
Hemoglobin: 12.8 g/dL (ref 12.0–15.0)
MCH: 28.7 pg (ref 26.0–34.0)
MCHC: 33.1 g/dL (ref 30.0–36.0)
MCV: 86.8 fL (ref 80.0–100.0)
Platelets: 323 10*3/uL (ref 150–400)
RBC: 4.46 MIL/uL (ref 3.87–5.11)
RDW: 13.6 % (ref 11.5–15.5)
WBC: 9.7 10*3/uL (ref 4.0–10.5)
nRBC: 0 % (ref 0.0–0.2)

## 2020-08-19 LAB — ECHOCARDIOGRAM COMPLETE
AR max vel: 2.07 cm2
AV Area VTI: 2.22 cm2
AV Area mean vel: 2.26 cm2
AV Mean grad: 4 mmHg
AV Peak grad: 7.7 mmHg
Ao pk vel: 1.39 m/s
Area-P 1/2: 4.6 cm2
Height: 62 in
S' Lateral: 2.87 cm
Weight: 2720 oz

## 2020-08-19 LAB — COMPREHENSIVE METABOLIC PANEL
ALT: 12 U/L (ref 0–44)
AST: 22 U/L (ref 15–41)
Albumin: 4.1 g/dL (ref 3.5–5.0)
Alkaline Phosphatase: 92 U/L (ref 38–126)
Anion gap: 10 (ref 5–15)
BUN: 17 mg/dL (ref 8–23)
CO2: 26 mmol/L (ref 22–32)
Calcium: 9.3 mg/dL (ref 8.9–10.3)
Chloride: 101 mmol/L (ref 98–111)
Creatinine, Ser: 0.88 mg/dL (ref 0.44–1.00)
GFR calc non Af Amer: >60 mL/min (ref >60)
Glucose, Bld: 116 mg/dL — ABNORMAL HIGH (ref 70–99)
Potassium: 4.1 mmol/L (ref 3.5–5.1)
Sodium: 137 mmol/L (ref 135–145)
Total Bilirubin: 0.7 mg/dL (ref 0.3–1.2)
Total Protein: 7.7 g/dL (ref 6.5–8.1)

## 2020-08-19 LAB — LIPID PANEL
Cholesterol: 200 mg/dL (ref 0–200)
HDL: 69 mg/dL (ref >40)
LDL Cholesterol: 114 mg/dL — ABNORMAL HIGH (ref 0–99)
Total CHOL/HDL Ratio: 2.9 RATIO
Triglycerides: 83 mg/dL (ref <150)
VLDL: 17 mg/dL (ref 0–40)

## 2020-08-19 LAB — RESPIRATORY PANEL BY RT PCR (FLU A&B, COVID)
Influenza A by PCR: NEGATIVE
Influenza B by PCR: NEGATIVE
SARS Coronavirus 2 by RT PCR: NEGATIVE

## 2020-08-19 LAB — GLUCOSE, CAPILLARY: Glucose-Capillary: 108 mg/dL — ABNORMAL HIGH (ref 70–99)

## 2020-08-19 LAB — APTT: aPTT: 27 seconds (ref 24–36)

## 2020-08-19 LAB — PROTIME-INR
INR: 0.9 (ref 0.8–1.2)
Prothrombin Time: 11.5 seconds (ref 11.4–15.2)

## 2020-08-19 MED ORDER — ASPIRIN 81 MG PO CHEW
324.0000 mg | CHEWABLE_TABLET | Freq: Once | ORAL | Status: AC
  Administered 2020-08-19: 324 mg via ORAL

## 2020-08-19 MED ORDER — RISAQUAD PO CAPS
1.0000 | ORAL_CAPSULE | Freq: Every day | ORAL | Status: DC
  Administered 2020-08-19: 1 via ORAL
  Filled 2020-08-19: qty 1

## 2020-08-19 MED ORDER — METOPROLOL SUCCINATE ER 50 MG PO TB24
50.0000 mg | ORAL_TABLET | Freq: Every day | ORAL | Status: DC
  Administered 2020-08-19: 50 mg via ORAL
  Filled 2020-08-19: qty 1

## 2020-08-19 MED ORDER — ASPIRIN EC 81 MG PO TBEC: 81.0000 mg | DELAYED_RELEASE_TABLET | Freq: Every day | ORAL | Status: DC

## 2020-08-19 MED ORDER — ATORVASTATIN CALCIUM 20 MG PO TABS
40.0000 mg | ORAL_TABLET | Freq: Every day | ORAL | Status: DC
  Administered 2020-08-19: 40 mg via ORAL
  Filled 2020-08-19: qty 2

## 2020-08-19 MED ORDER — ACETAMINOPHEN 325 MG PO TABS: 650.0000 mg | ORAL_TABLET | ORAL | Status: DC | PRN

## 2020-08-19 MED ORDER — SENNOSIDES-DOCUSATE SODIUM 8.6-50 MG PO TABS
1.0000 | ORAL_TABLET | Freq: Every day | ORAL | Status: DC
  Administered 2020-08-19: 1 via ORAL
  Filled 2020-08-19: qty 1

## 2020-08-19 MED ORDER — VITAMIN D3 25 MCG (1000 UNIT) PO TABS
1000.0000 [IU] | ORAL_TABLET | Freq: Every day | ORAL | Status: DC
  Administered 2020-08-19: 1000 [IU] via ORAL
  Filled 2020-08-19 (×2): qty 1

## 2020-08-19 MED ORDER — CLOPIDOGREL BISULFATE 75 MG PO TABS
75.0000 mg | ORAL_TABLET | Freq: Every day | ORAL | Status: DC
  Administered 2020-08-19: 75 mg via ORAL
  Filled 2020-08-19: qty 1

## 2020-08-19 MED ORDER — ADULT MULTIVITAMIN W/MINERALS CH
1.0000 | ORAL_TABLET | Freq: Every day | ORAL | Status: DC
  Administered 2020-08-19: 1 via ORAL
  Filled 2020-08-19: qty 1

## 2020-08-19 MED ORDER — ACETAMINOPHEN 160 MG/5ML PO SOLN
650.0000 mg | ORAL | Status: DC | PRN
  Filled 2020-08-19: qty 20.3

## 2020-08-19 MED ORDER — ASPIRIN 81 MG PO CHEW
CHEWABLE_TABLET | ORAL | Status: AC
  Filled 2020-08-19: qty 4

## 2020-08-19 MED ORDER — INSULIN ASPART 100 UNIT/ML ~~LOC~~ SOLN: 0.0000 [IU] | Freq: Every day | SUBCUTANEOUS | Status: DC

## 2020-08-19 MED ORDER — STROKE: EARLY STAGES OF RECOVERY BOOK: Freq: Once | Status: DC

## 2020-08-19 MED ORDER — CLOPIDOGREL BISULFATE 75 MG PO TABS
75.0000 mg | ORAL_TABLET | Freq: Every day | ORAL | 0 refills | Status: DC
Start: 2020-08-20 — End: 2022-09-19

## 2020-08-19 MED ORDER — ACETAMINOPHEN 650 MG RE SUPP: 650.0000 mg | RECTAL | Status: DC | PRN

## 2020-08-19 MED ORDER — INSULIN ASPART 100 UNIT/ML ~~LOC~~ SOLN: 0.0000 [IU] | Freq: Three times a day (TID) | SUBCUTANEOUS | Status: DC

## 2020-08-19 MED ORDER — ENOXAPARIN SODIUM 40 MG/0.4ML ~~LOC~~ SOLN: 40.0000 mg | SUBCUTANEOUS | Status: DC
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
## 2020-08-19 NOTE — Progress Notes (Signed)
[place] paged for CODE STROKE; just afterward, [place] paged for CODE BLUE on ICU; [facility] called ED secretary and asked staff to page [facility] if assistance is needed w/pt. or family.  [place] remains available.
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
## 2020-08-19 NOTE — ED Notes (Signed)
Per Dr. Owens Shark, repeat EKG performed.

## 2020-08-19 NOTE — ED Notes (Signed)
MD Alfred Levins called lab to get update on ptt/clotting labs. Lab states it is hemolyzed. This RN redrew and sent repeat blue top to lab at this time.

## 2020-08-19 NOTE — ED Notes (Signed)
Pt back from MRI, placed back on monitoring equipment.

## 2020-08-19 NOTE — Progress Notes (Signed)
OT Cancellation Note  Patient Details Name: Bianca Baker MRN: [id number] DOB: [birth year]   Cancelled Treatment:    Reason Eval/Treat Not Completed: OT screened, no needs identified, will sign off  Upon chart review and in speaking with physical therapist, Pt is performing ADLs/fxl mobility at her reported baseline. Do not currently anticipate acute OT needs for pt nor the need for OT f/u upon d/c from acute setting. Will complete order and sign off at this time. Thank you.  Gerrianne Scale, [place], OTR/L ascom [phone] 08/19/20, 10:26 AM

## 2020-08-19 NOTE — ED Notes (Signed)
Pharmacy at bedside to verify home meds.

## 2020-08-19 NOTE — Evaluation (Signed)
Physical Therapy Evaluation Patient Details Name: Bianca Baker MRN: [id number] DOB: 09/01/1936 Today's Date: 08/19/2020   History of Present Illness  Bianca Baker is a 84 y.o. female with medical history significant for DM, HTN, right facial palsy from old CVA, depression and history of breast cancer who presents to the emergency room as a code stroke after she awoke at 51 PM on 08/18/2020 with right-sided numbness including face, arm and leg.  MRI negative.  Clinical Impression  Patient received on stretcher. She reports she has walked to the bathroom already. Reports she is feeling better, wants to go home. She performed bed mobility independently. Transferred with min guard. She ambulated 150 feet without ad with min guard/ supervision. She does not require follow up at this time as she appears to be back to baseline.     Follow Up Recommendations No PT follow up    Equipment Recommendations  None recommended by PT    Recommendations for Other Services       Precautions / Restrictions Precautions Precautions: None Restrictions Weight Bearing Restrictions: No      Mobility  Bed Mobility Overal bed mobility: Independent                Transfers Overall transfer level: Independent Equipment used: None                Ambulation/Gait Ambulation/Gait assistance: Independent Gait Distance (Feet): 150 Feet Assistive device: None Gait Pattern/deviations: WFL(Within Functional Limits) Gait velocity: WNL   General Gait Details: generally safe, gait WNL. No lob, no difficulties reported or observed.  Stairs            Wheelchair Mobility    Modified Rankin (Stroke Patients Only)       Balance Overall balance assessment: Independent                                           Pertinent Vitals/Pain Pain Assessment: No/denies pain    Home Living Family/patient expects to be discharged to:: Private residence Living  Arrangements: Alone Available Help at Discharge: Family;Available PRN/intermittently Type of Home: House Home Access: Stairs to enter   [employer] of Steps: 4 Home Layout: One level Home Equipment: None Additional Comments: patient reports her daughter is next door. her daughter takes her shopping as she does not drive.    Prior Function Level of Independence: Independent               Hand Dominance        Extremity/Trunk Assessment   Upper Extremity Assessment Upper Extremity Assessment: Overall WFL for tasks assessed    Lower Extremity Assessment Lower Extremity Assessment: Overall WFL for tasks assessed    Cervical / Trunk Assessment Cervical / Trunk Assessment: Normal  Communication   Communication: No difficulties  Cognition Arousal/Alertness: Awake/alert Behavior During Therapy: WFL for tasks assessed/performed Overall Cognitive Status: Within Functional Limits for tasks assessed                                        General Comments      Exercises     Assessment/Plan    PT Assessment Patent does not need any further PT services  PT Problem List         PT Treatment Interventions  PT Goals (Current goals can be found in the Care Plan section)  Acute Rehab PT Goals Patient Stated Goal: to return home PT Goal Formulation: With patient Time For Goal Achievement: 08/19/20 Potential to Achieve Goals: Good    Frequency     Barriers to discharge        Co-evaluation               AM-PAC PT "6 Clicks" Mobility  Outcome Measure Help needed turning from your back to your side while in a flat bed without using bedrails?: None Help needed moving from lying on your back to sitting on the side of a flat bed without using bedrails?: None Help needed moving to and from a bed to a chair (including a wheelchair)?: None Help needed standing up from a chair using your arms (e.g., wheelchair or bedside chair)?:  None Help needed to walk in hospital room?: None Help needed climbing 3-5 steps with a railing? : None 6 Click Score: 24    End of Session   Activity Tolerance: Patient tolerated treatment well Patient left: in bed;with call bell/phone within reach Nurse Communication: Mobility status      Time: 7262-0355 PT Time Calculation (min) (ACUTE ONLY): 11 min   Charges:   PT Evaluation $PT Eval Moderate Complexity: 1 Mod          Delcia Spitzley, PT, GCS 08/19/20,10:15 AM
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
## 2020-08-19 NOTE — Discharge Instructions (Signed)
Keep log of her blood pressure at home and review with PCP

## 2020-08-19 NOTE — Consult Note (Signed)
[facility] Tele[facility]urology Consult Services   Date of Service:   08/19/2020 00:23:44  Impression:     .  G45.9 - Transient cerebral ischemic attack, unspecified  Comments/Sign-Out: 84 y/o woman with HTN, DM, who presents transient right sided numbness. Last known well at 2130 when she went to bed. Symptoms resolved.  Metrics: Last Known Well: 08/18/2020 21:30:00 [facility] Notification Time: 08/19/2020 00:23:44 Arrival Time: 08/18/2020 23:42:00 Stamp Time: 08/19/2020 00:23:44 Time First Login Attempt: 08/19/2020 00:28:55 Symptoms: right sided numbness. NIHSS Start Assessment Time: 08/19/2020 00:29:20 Patient is not a candidate for Thrombolytic. Thrombolytic Medical Decision: 08/19/2020 00:34:39 Patient was not deemed candidate for Thrombolytic because of following reasons: Resolved symptoms (no residual disabling symptoms).  CT head was reviewed.  ED Physician notified of diagnostic impression and management plan on 08/19/2020 00:41:16  Advanced Imaging: Advanced Imaging Not Recommended because:  Clinical Presentation is not Suggestive of LVO and NIHSS is 1 (right facial droop is chronic).   Our recommendations are outlined below.  Recommendations:     .  Activate Stroke Protocol Admission/Order Set     .  Stroke/Telemetry Floor     .  Neuro Checks     .  Bedside Swallow Eval     .  DVT Prophylaxis     .  IV Fluids, Normal Saline     .  Head of Bed 30 Degrees     .  Euglycemia and Avoid Hyperthermia (PRN Acetaminophen)     .  ASA and Plavix     .  Covid testing  Routine Consultation with [facility] for Follow up Care  Sign Out:     .  Discussed with Emergency Department Provider    ------------------------------------------------------------------------------  History of Present Illness: Patient is a 84 year old Female.  Patient was brought by EMS for symptoms of right sided numbness.  84 y/o woman with HTN, DM, who  presents transient right sided numbness. Last known well at 2130 when she went to bed. She said she woke up at 2300 with symptoms. Patient has a baseline right facial droop due to previous nerve injury. Numbness has resolved. On ASA. Patient says she has received Moderna vaccination x 2 for Covid. NIHSS 1 for right facial droop (chronic).   Anticoagulant use:  No  Antiplatelet use: ASA  Allergies:  Reviewed    Examination: BP(177/79), Pulse(73), Blood Glucose(108) 1A: Level of Consciousness - Alert; keenly responsive + 0 1B: Ask Month and Age - Both Questions Right + 0 1C: Blink Eyes & Squeeze Hands - Performs Both Tasks + 0 2: Test Horizontal Extraocular Movements - Normal + 0 3: Test Visual Fields - No Visual Loss + 0 4: Test Facial Palsy (Use Grimace if Obtunded) - Minor paralysis (flat nasolabial fold, smile asymmetry) + 1 5A: Test Left Arm Motor Drift - No Drift for 10 Seconds + 0 5B: Test Right Arm Motor Drift - No Drift for 10 Seconds + 0 6A: Test Left Leg Motor Drift - No Drift for 5 Seconds + 0 6B: Test Right Leg Motor Drift - No Drift for 5 Seconds + 0 7: Test Limb Ataxia (FNF/Heel-Shin) - No Ataxia + 0 8: Test Sensation - Normal; No sensory loss + 0 9: Test Language/Aphasia - Normal; No aphasia + 0 10: Test Dysarthria - Normal + 0 11: Test Extinction/Inattention - No abnormality + 0  NIHSS Score: 1  Pre-Morbid Modified Rankin Scale: 0 Points = No symptoms at all   Patient/Family was informed the  Neurology Consult would occur via TeleHealth consult by way of interactive audio and video telecommunications and consented to receiving care in this manner.   Patient is being evaluated for possible acute neurologic impairment and high probability of imminent or life-threatening deterioration. I spent total of 15 minutes providing care to this patient, including time for face to face visit via telemedicine, review of medical records, imaging studies and discussion of findings  with providers, the patient and/or family.   Dr Meryl Crutch   [facility] [phone]  Case [id number]

## 2020-08-19 NOTE — Progress Notes (Signed)
*  PRELIMINARY RESULTS* Echocardiogram 2D Echocardiogram has been performed.  Sherrie Sport 08/19/2020, 11:19 AM

## 2020-08-19 NOTE — ED Notes (Addendum)
Dr. Alfred Levins at bedside. Right sided numbness without weakness.   No headache, chest pain and hx of facial droop. Pt reports her speech sounds normal to her.

## 2020-08-19 NOTE — ED Notes (Signed)
Pt transported to MRI 

## 2020-09-17 ENCOUNTER — Other Ambulatory Visit: Payer: Self-pay

## 2020-09-17 ENCOUNTER — Ambulatory Visit
Admission: RE | Admit: 2020-09-17 | Discharge: 2020-09-17 | Disposition: A | Discharge status: Home / Self Care | Payer: Medicare Other | Source: Ambulatory Visit | Attending: Internal Medicine | Admitting: Internal Medicine

## 2020-09-17 DIAGNOSIS — Z1231 Encounter for screening mammogram for malignant neoplasm of breast: Secondary | ICD-10-CM | POA: Diagnosis not present

## 2020-11-25 IMAGING — MR MR HEAD W/O CM
11 series · 45 of 48 positions shown · non-contrast
Comparison: CT head 07/26/2019

CLINICAL DATA: Left facial numbness

EXAM:
MRI HEAD WITHOUT CONTRAST
TECHNIQUE: Multiplanar, multiecho pulse sequences of the brain and surrounding
structures were obtained without intravenous contrast.

[Series 5: ax dwi_tracew · axial · 3.0mm · 0.60mm/px · z∈[-95,+60]mm · 4 of 48 slices shown]
[im 1/48]
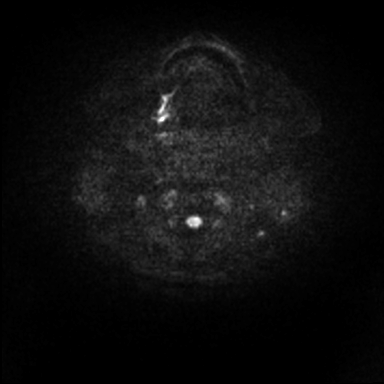
[im 16/48]
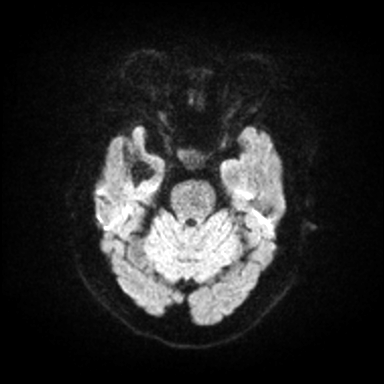
[im 32/48]
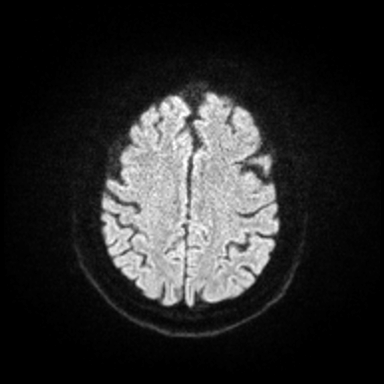
[im 48/48]
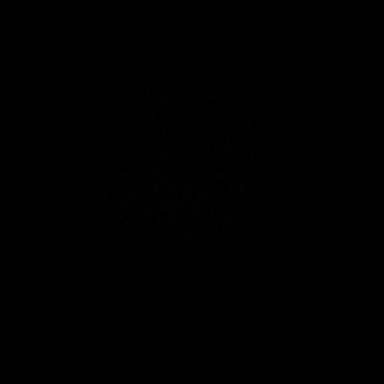

[Series 6: ax dwi_adc · axial · 3.0mm · 0.60mm/px · z∈[-95,+56]mm · 4 of 47 slices shown]
[im 1/47]
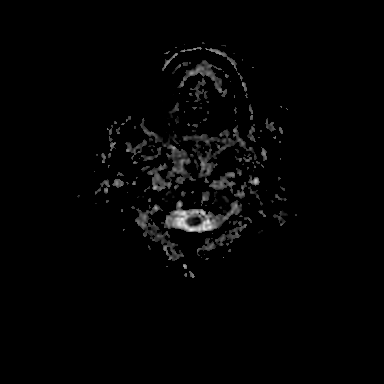
[im 16/47]
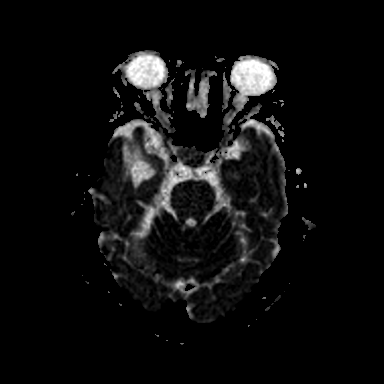
[im 31/47]
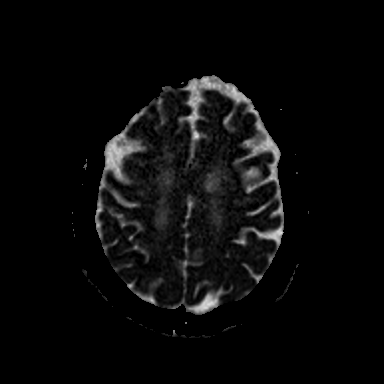
[im 47/47]
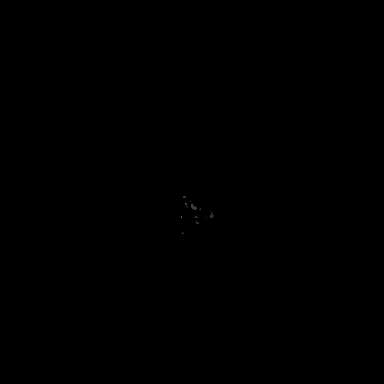

[Series 7: cor dwi_tracew · coronal · 5.0mm · 0.60mm/px · 3 of 40 slices shown]
[im 1/40]
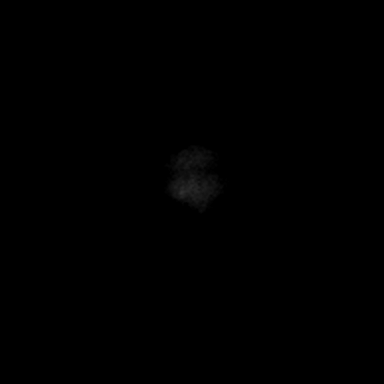
[im 20/40]
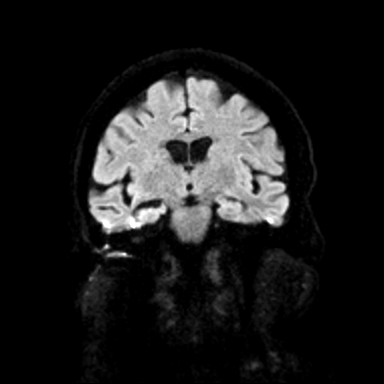
[im 40/40]
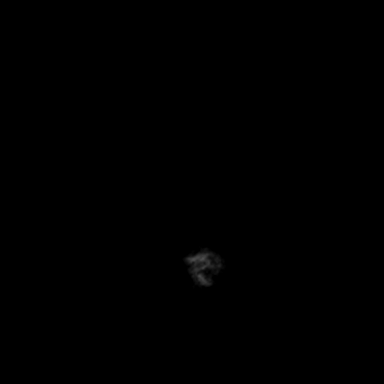

[Series 8: cor dwi_adc · coronal · 5.0mm · 0.60mm/px · 3 of 37 slices shown]
[im 1/37]
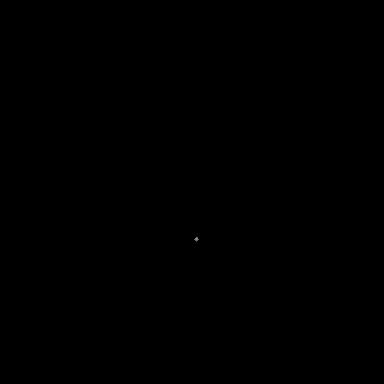
[im 19/37]
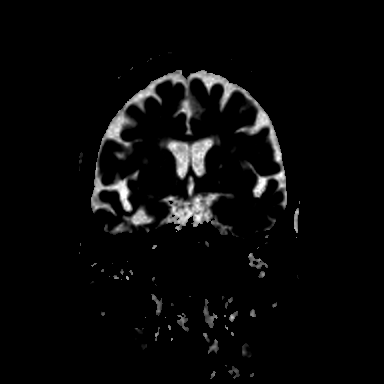
[im 37/37]
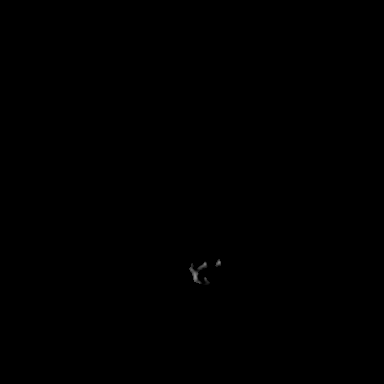

[Series 9: T1 · sagittal · 5.0mm · 0.62mm/px · 2 of 23 slices shown (1 of 2)]
[im 1/23]
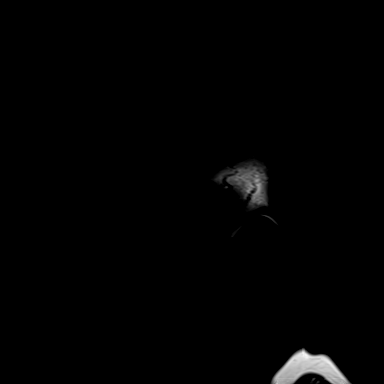
[im 23/23]
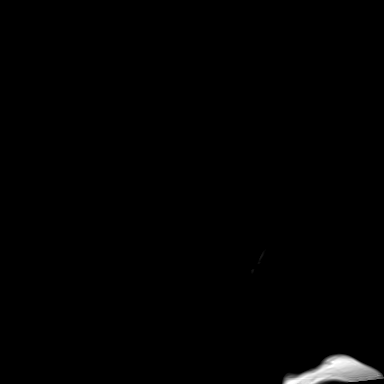

[Series 10: T2 · axial · 5.0mm · 0.53mm/px · z∈[-90,+54]mm · 2 of 25 slices shown (1 of 2)]
[im 1/25]
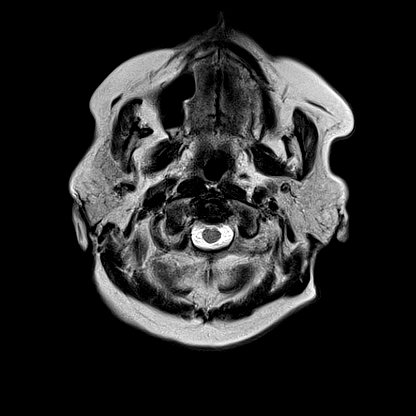
[im 25/25]
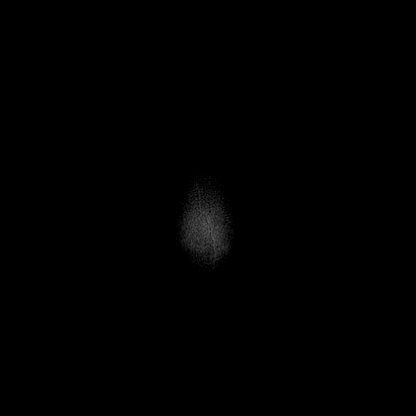

[Series 12: pha_images · axial · 3.0mm · 0.90mm/px · z∈[-103,+70]mm · 5 of 56 slices shown]
[im 1/56]
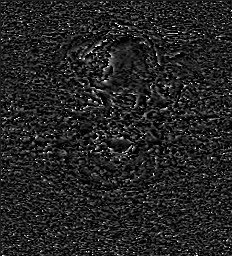
[im 14/56]
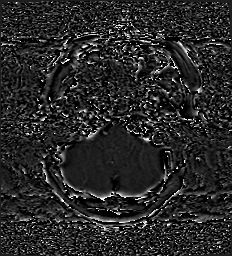
[im 28/56]
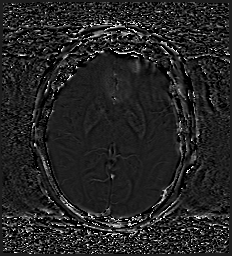
[im 42/56]
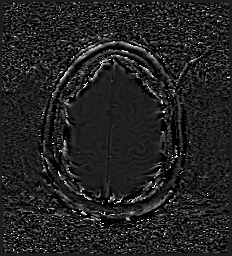
[im 56/56]
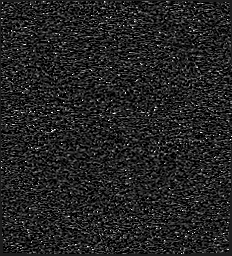

[Series 13: swi_images · axial · 3.0mm · 0.90mm/px · z∈[-106,+70]mm · 5 of 60 slices shown]
[im 1/60]
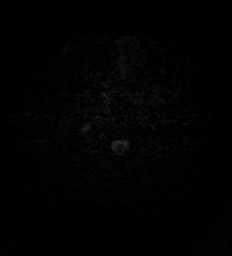
[im 15/60]
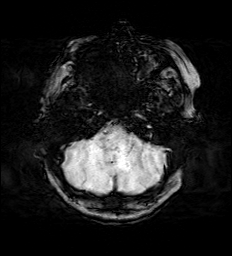
[im 30/60]
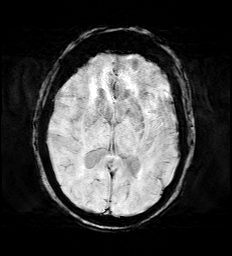
[im 45/60]
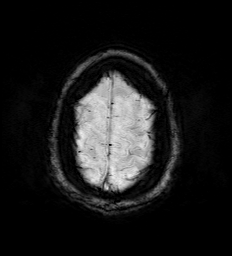
[im 60/60]
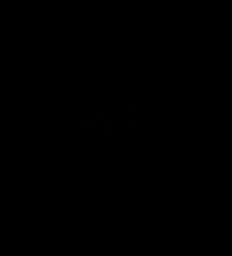

[Series 16: T2 · coronal · 5.0mm · 0.57mm/px · 2 of 29 slices shown (2 of 2)]
[im 1/29]
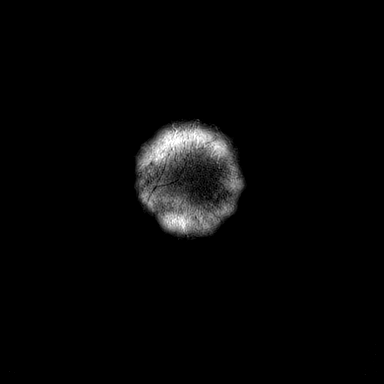
[im 29/29]
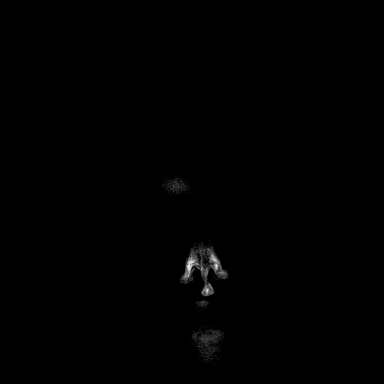

[Series 17: T1 · axial · 1.0mm · 0.98mm/px · z∈[-104,+70]mm · 11 of 176 slices shown (2 of 2)]
[im 1/176]
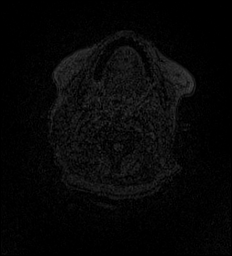
[im 14/176]
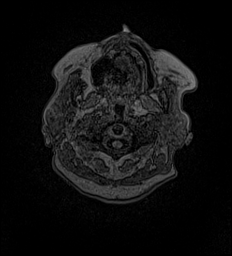
[im 27/176]
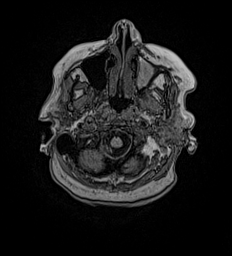
[im 41/176]
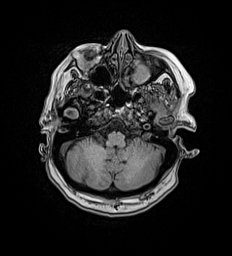
[im 54/176]
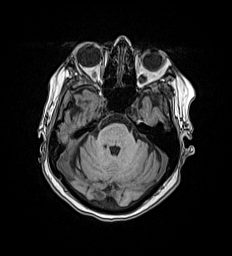
[im 68/176]
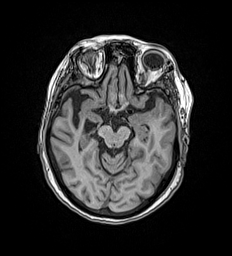
[im 81/176]
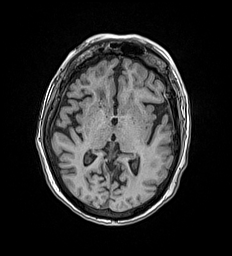
[im 95/176]
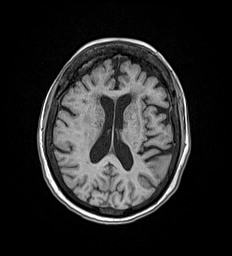
[im 122/176]
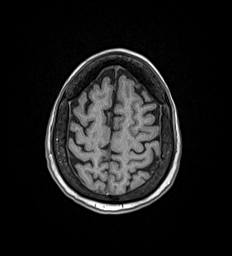
[im 149/176]
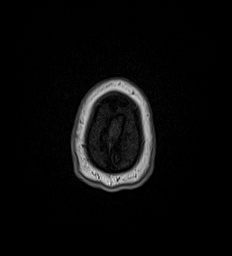
[im 176/176]
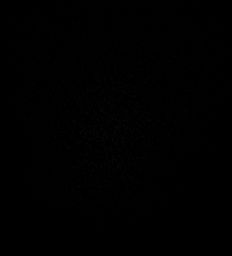

[Series 18: FLAIR · axial · 3.0mm · 0.53mm/px · z∈[-99,+63]mm · 4 of 55 slices shown]
[im 1/55]
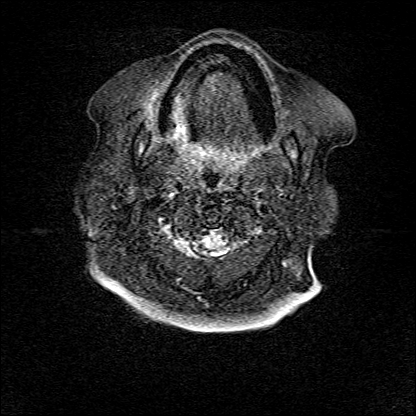
[im 19/55]
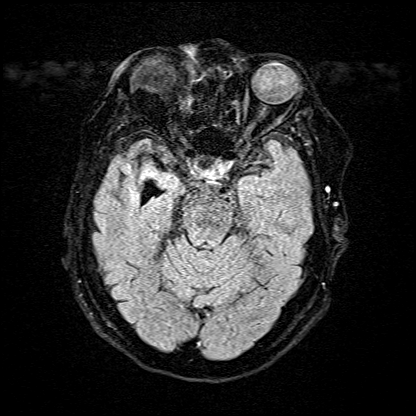
[im 37/55]
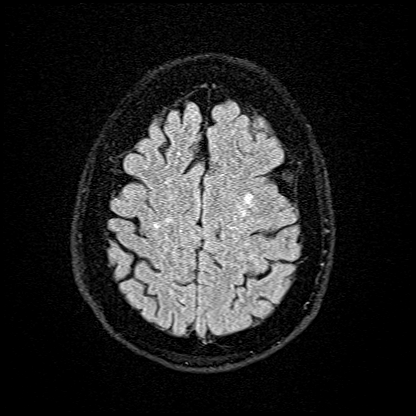
[im 55/55]
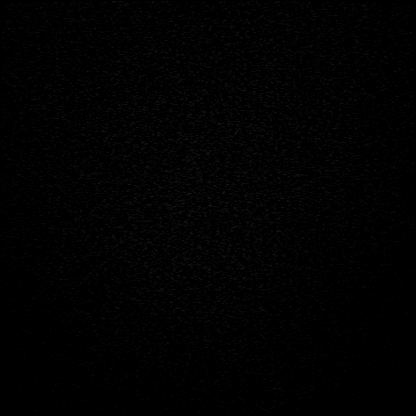

[45 of 48 positions shown; findings below may reference images not displayed]

FINDINGS: Brain: Negative for acute infarct

Moderate chronic microvascular ischemic changes in the white matter.
Brainstem normal. Mild atrophy without hydrocephalus. Negative for
hemorrhage or mass.

Vascular: Normal arterial flow voids.

Skull and upper cervical spine: Negative

Sinuses/Orbits: Mucosal edema paranasal sinuses most notably left
maxillary sinus. Bilateral cataract surgery

Other: None
IMPRESSION: Atrophy and chronic microvascular ischemic change. No acute infarct.

## 2021-03-16 ENCOUNTER — Emergency Department: Payer: Medicare Other

## 2021-03-16 ENCOUNTER — Emergency Department
Admission: EM | Admit: 2021-03-16 | Discharge: 2021-03-16 | Disposition: A | Discharge status: Home / Self Care | Payer: Medicare Other | Attending: Emergency Medicine | Admitting: Emergency Medicine

## 2021-03-16 ENCOUNTER — Other Ambulatory Visit: Payer: Self-pay

## 2021-03-16 DIAGNOSIS — Z853 Personal history of malignant neoplasm of breast: Secondary | ICD-10-CM | POA: Insufficient documentation

## 2021-03-16 DIAGNOSIS — Z79899 Other long term (current) drug therapy: Secondary | ICD-10-CM | POA: Diagnosis not present

## 2021-03-16 DIAGNOSIS — Z96652 Presence of left artificial knee joint: Secondary | ICD-10-CM | POA: Diagnosis not present

## 2021-03-16 DIAGNOSIS — W109XXA Fall (on) (from) unspecified stairs and steps, initial encounter: Secondary | ICD-10-CM | POA: Insufficient documentation

## 2021-03-16 DIAGNOSIS — M1990 Unspecified osteoarthritis, unspecified site: Secondary | ICD-10-CM | POA: Diagnosis not present

## 2021-03-16 DIAGNOSIS — S8001XA Contusion of right knee, initial encounter: Secondary | ICD-10-CM | POA: Insufficient documentation

## 2021-03-16 DIAGNOSIS — N183 Chronic kidney disease, stage 3 unspecified: Secondary | ICD-10-CM | POA: Diagnosis not present

## 2021-03-16 DIAGNOSIS — Z7982 Long term (current) use of aspirin: Secondary | ICD-10-CM | POA: Insufficient documentation

## 2021-03-16 DIAGNOSIS — I129 Hypertensive chronic kidney disease with stage 1 through stage 4 chronic kidney disease, or unspecified chronic kidney disease: Secondary | ICD-10-CM | POA: Insufficient documentation

## 2021-03-16 DIAGNOSIS — S8991XA Unspecified injury of right lower leg, initial encounter: Secondary | ICD-10-CM | POA: Diagnosis present

## 2021-03-16 DIAGNOSIS — M1711 Unilateral primary osteoarthritis, right knee: Secondary | ICD-10-CM

## 2021-03-16 DIAGNOSIS — Z87891 Personal history of nicotine dependence: Secondary | ICD-10-CM | POA: Diagnosis not present

## 2021-03-16 DIAGNOSIS — E1122 Type 2 diabetes mellitus with diabetic chronic kidney disease: Secondary | ICD-10-CM | POA: Insufficient documentation

## 2021-03-16 MED ORDER — HYDROCODONE-ACETAMINOPHEN 5-325 MG PO TABS
1.0000 | ORAL_TABLET | Freq: Once | ORAL | Status: AC
  Administered 2021-03-16: 1 via ORAL
  Filled 2021-03-16: qty 1

## 2021-03-16 MED ORDER — TRAMADOL HCL 50 MG PO TABS: 50.0000 mg | ORAL_TABLET | Freq: Four times a day (QID) | ORAL | 0 refills | Status: AC | PRN

## 2021-03-16 NOTE — Discharge Instructions (Addendum)
No fractures of your right knee on x-ray today.  A prescription for tramadol was sent to the pharmacy.  This is 1 every 6 hours as needed for pain.  Be aware that this medication could cause drowsiness and increase your risk for falling.  Use a walker or cane if available for added support and protection.  Follow-up with your orthopedist if any continued problems with your knee.

## 2021-03-16 NOTE — ED Notes (Signed)
See triage note  States she fell about 1 week ago  States she was going down steps and landed on right knee
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
## 2021-03-16 NOTE — ED Triage Notes (Signed)
Pt comes with c/o right knee pain following a trip and fall few weeks ago. Pt states appt with MD but she can't wait that long.

## 2021-09-16 ENCOUNTER — Ambulatory Visit: Payer: Medicare Other | Attending: Internal Medicine

## 2021-09-16 ENCOUNTER — Other Ambulatory Visit: Payer: Self-pay

## 2021-09-16 DIAGNOSIS — Z23 Encounter for immunization: Secondary | ICD-10-CM

## 2021-09-16 MED ORDER — MODERNA COVID-19 BIVAL BOOSTER 50 MCG/0.5ML IM SUSP
INTRAMUSCULAR | 0 refills | Status: AC
  Filled 2021-09-16: qty 0.5, 1d supply, fill #0

## 2021-09-16 NOTE — Progress Notes (Signed)
   Covid-19 Vaccination Clinic  Name:  Bianca Baker    MRN: [id number] DOB: 11-25-1935  09/16/2021  Ms. Rings was observed post Covid-19 immunization for 15 minutes without incident. She was provided with Vaccine Information Sheet and instruction to access the V-Safe system.   [name] was instructed to call 911 with any severe reactions post vaccine: Difficulty breathing  Swelling of face and throat  A fast heartbeat  A bad rash all over body  Dizziness and weakness   Immunizations Administered     Name Date Dose VIS Date Route   Moderna Covid-19 vaccine Bivalent Booster 09/16/2021  2:45 PM 0.5 mL 06/26/2021 Intramuscular   Manufacturer: Moderna   Lot: [id number]   [place]: [id number]

## 2021-10-27 ENCOUNTER — Other Ambulatory Visit: Payer: Self-pay | Admitting: Family Medicine

## 2021-10-27 DIAGNOSIS — Z1231 Encounter for screening mammogram for malignant neoplasm of breast: Secondary | ICD-10-CM

## 2021-11-11 ENCOUNTER — Ambulatory Visit: Payer: Medicare Other

## 2021-11-29 ENCOUNTER — Other Ambulatory Visit: Payer: Self-pay

## 2021-11-29 ENCOUNTER — Ambulatory Visit
Admission: RE | Admit: 2021-11-29 | Discharge: 2021-11-29 | Disposition: A | Discharge status: Home / Self Care | Payer: Medicare Other | Source: Ambulatory Visit | Attending: Family Medicine | Admitting: Family Medicine

## 2021-11-29 DIAGNOSIS — Z1231 Encounter for screening mammogram for malignant neoplasm of breast: Secondary | ICD-10-CM | POA: Diagnosis not present

## 2022-09-18 ENCOUNTER — Other Ambulatory Visit: Payer: Self-pay

## 2022-09-18 ENCOUNTER — Emergency Department: Payer: Medicare Other

## 2022-09-18 ENCOUNTER — Ambulatory Visit
Admission: RE | Admit: 2022-09-18 | Discharge: 2022-09-19 | Disposition: A | Discharge status: Home / Self Care | Payer: Medicare Other | Attending: Emergency Medicine | Admitting: Emergency Medicine

## 2022-09-18 DIAGNOSIS — Z7901 Long term (current) use of anticoagulants: Secondary | ICD-10-CM | POA: Insufficient documentation

## 2022-09-18 DIAGNOSIS — W44F3XA Food entering into or through a natural orifice, initial encounter: Secondary | ICD-10-CM | POA: Insufficient documentation

## 2022-09-18 DIAGNOSIS — I10 Essential (primary) hypertension: Secondary | ICD-10-CM | POA: Diagnosis not present

## 2022-09-18 DIAGNOSIS — Z8673 Personal history of transient ischemic attack (TIA), and cerebral infarction without residual deficits: Secondary | ICD-10-CM | POA: Diagnosis not present

## 2022-09-18 DIAGNOSIS — T18128A Food in esophagus causing other injury, initial encounter: Secondary | ICD-10-CM | POA: Insufficient documentation

## 2022-09-18 DIAGNOSIS — K219 Gastro-esophageal reflux disease without esophagitis: Secondary | ICD-10-CM | POA: Diagnosis not present

## 2022-09-18 DIAGNOSIS — Z79899 Other long term (current) drug therapy: Secondary | ICD-10-CM | POA: Insufficient documentation

## 2022-09-18 DIAGNOSIS — E119 Type 2 diabetes mellitus without complications: Secondary | ICD-10-CM | POA: Insufficient documentation

## 2022-09-18 DIAGNOSIS — Z87891 Personal history of nicotine dependence: Secondary | ICD-10-CM | POA: Insufficient documentation

## 2022-09-18 MED ORDER — GLUCAGON HCL RDNA (DIAGNOSTIC) 1 MG IJ SOLR
1.0000 mg | Freq: Once | INTRAMUSCULAR | Status: AC
  Administered 2022-09-18: 1 mg via INTRAVENOUS
  Filled 2022-09-18: qty 1

## 2022-09-18 NOTE — ED Notes (Signed)
Pt ambulated to toilet and back to bed. 

## 2022-09-18 NOTE — ED Provider Triage Note (Signed)
Emergency Medicine Provider Triage Evaluation Note  Bianca Baker, a 86 y.o. female  was evaluated in triage.  Pt complains of food bolus in her throat.  Reports much 1/2 hours ago she ate some pork chop, and felt like a large piece of the meat got stuck in her throat.  Since that time she has attempted to drink some soft drink, and had some regurgitation.  She now endorses some fullness in the central chest.  She gives a history of the same, the last time she had symptoms she had to have an upper endoscopy.  Review of Systems  Positive: Food bolus Negative: FCS  Physical Exam  BP (!) 176/84   Pulse 66   Temp 97.9 F (36.6 C) (Oral)   Resp 16   Ht '5\' 2"'$  (1.575 m)   Wt 79.4 kg   SpO2 96%   BMI 32.01 kg/m  Gen:   Awake, no distress  NAD Resp:  Normal effort CTA MSK:   Moves extremities without difficulty  ABD:  Soft, nontender  Medical Decision Making  Medically screening exam initiated at 6:12 PM.  Appropriate orders placed.  Bianca Baker was informed that the remainder of the evaluation will be completed by another provider, this initial triage assessment does not replace that evaluation, and the importance of remaining in the ED until their evaluation is complete.  Geriatric patient to the ED for evaluation of food bolus to the throat.  Presents in no acute distress with airway control, no excessive spitting, gagging, coughing, vomiting.   Melvenia Needles, PA-C 09/18/22 1814

## 2022-09-18 NOTE — ED Provider Notes (Cosign Needed Addendum)
[facility] Emergency Department Provider Note     Event Date/Time   First MD Initiated Contact with Patient 09/18/22 2004     (approximate)   History   food stuck in throat   HPI  Bianca Baker is a 86 y.o. female with a history of hyperlipidemia, hypertension, GERD, diabetes and prior EGD, presents to the ED for evaluation.  Pt complains of food bolus in her throat.  Reports proximately 1/2 hours ago she ate some pork chop, and felt like a large piece of the meat got stuck in her throat.  Since that time she has attempted to drink some soft drink, and had some regurgitation.  She now endorses some fullness in the central chest.  She gives a history of the same, the last time she had symptoms she had to have an upper endoscopy.     Physical Exam   Triage Vital Signs: ED Triage Vitals  Enc Vitals Group     BP 09/18/22 1809 (!) 176/84     Pulse Rate 09/18/22 1809 66     Resp 09/18/22 1809 16     Temp 09/18/22 1809 97.9 F (36.6 C)     Temp Source 09/18/22 1809 Oral     SpO2 09/18/22 1809 96 %     Weight 09/18/22 1805 175 lb (79.4 kg)     Height 09/18/22 1805 '5\' 2"'$  (1.575 m)     Head Circumference --      Peak Flow --      Pain Score 09/18/22 1805 0     Pain Loc --      Pain Edu? --      Excl. in [place]? --     Most recent vital signs: Vitals:   09/18/22 1809  BP: (!) 176/84  Pulse: 66  Resp: 16  Temp: 97.9 F (36.6 C)  SpO2: 96%    General Awake, no distress. NAD HEENT NCAT. PERRL. EOMI. No rhinorrhea. Mucous membranes are moist.  No airway compromise CV:  Good peripheral perfusion.  RESP:  Normal effort.  ABD:  No distention.    ED Results / Procedures / Treatments   Labs (all labs ordered are listed, but only abnormal results are displayed) Labs Reviewed - No data to display   EKG   RADIOLOGY  I personally viewed and evaluated these images as part of my medical decision making, as well as reviewing the written report  by the radiologist.  ED Provider Interpretation: no acute findings  DG Neck Soft Tissue  Result Date: 09/18/2022 CLINICAL DATA:  Food stuck in throat EXAM: NECK SOFT TISSUES - 1+ VIEW COMPARISON:  None Available. FINDINGS: There is no narrowing of the airways. Epiglottis is not thickened. No radiopaque foreign bodies are seen. Prevertebral soft tissues are unremarkable. Degenerative changes are noted in cervical spine. IMPRESSION: No significant radiographic abnormalities are seen in soft tissues of the neck. Cervical spondylosis. Electronically Signed   By: Elmer Picker M.D.   On: 09/18/2022 18:47     PROCEDURES:  Critical Care performed: No  Procedures   MEDICATIONS ORDERED IN ED: Medications  glucagon (human recombinant) (GLUCAGEN) injection 1 mg (1 mg Intravenous Given 09/18/22 2039)     IMPRESSION / MDM / ASSESSMENT AND PLAN / ED COURSE  I reviewed the triage vital signs and the nursing notes.  Differential diagnosis includes, but is not limited to, food bolus, esophageal stricture, reflux esophagitis, Boerhaave syndrome  Patient's presentation is most consistent with acute presentation with potential threat to life or bodily function.  ----------------------------------------- 9:26 PM on 09/18/2022 ----------------------------------------- S/W Dr. Marius Ditch: She will evaluate the patient in the ED in the morning for EGD procedure.  She remarked that since the patient was stable at this time with control of oral secretions and airway, patient would be appropriate for intervention tomorrow morning.  Geriatric patient to the ED for evaluation of food bolus complaint.  She reports onset after she ate some pork chops and some beans.  Patient with inability to pass any liquids orally since that time.  She presents in no acute distress, without airway compromise and able to control oral secretions.  Patient able to speak in complete sentences.  No  x-ray evidence of any mediastinal widening on plain films, based on my review of images.  Patient's diagnosis is consistent with food bolus impaction. Patient stable at this time, and will be boarded in the ED until her EGD procedure in the morning.  Patient and her daughter made aware of the plan and are agreeable to proceed.   FINAL CLINICAL IMPRESSION(S) / ED DIAGNOSES   Final diagnoses:  Food impaction of esophagus, initial encounter     Rx / DC Orders   ED Discharge Orders     None        Note:  This document was prepared using Dragon voice recognition software and may include unintentional dictation errors.    Melvenia Needles, PA-C 09/18/22 2157    Melvenia Needles, PA-C 09/18/22 2252    Arta Silence, MD 09/19/22 [phone]

## 2022-09-18 NOTE — ED Triage Notes (Addendum)
Pt to ED for food bolus stuck in throat. PT was eating beans, pork and bread and thinks pork roast got stuck in throat. Has had this problem before and has had esophageal stretching done.   Voice is clear. States tries to swallow water and it will not go down. States food bolus may have moved lower than throat but still stuck.  PA at bedside.

## 2022-09-19 ENCOUNTER — Emergency Department: Payer: Medicare Other | Admitting: Certified Registered"

## 2022-09-19 ENCOUNTER — Encounter
Admission: RE | Disposition: A | Discharge status: Home / Self Care | Payer: Self-pay | Source: Home / Self Care | Attending: Emergency Medicine

## 2022-09-19 DIAGNOSIS — T18128A Food in esophagus causing other injury, initial encounter: Secondary | ICD-10-CM | POA: Diagnosis not present

## 2022-09-19 DIAGNOSIS — W44F3XA Food entering into or through a natural orifice, initial encounter: Secondary | ICD-10-CM | POA: Diagnosis not present

## 2022-09-19 HISTORY — PX: ESOPHAGOGASTRODUODENOSCOPY (EGD) WITH PROPOFOL: SHX5813

## 2022-09-19 SURGERY — ESOPHAGOGASTRODUODENOSCOPY (EGD) WITH PROPOFOL
Anesthesia: General

## 2022-09-19 SURGERY — EGD (ESOPHAGOGASTRODUODENOSCOPY)
Anesthesia: Choice

## 2022-09-19 MED ORDER — PROPOFOL 10 MG/ML IV BOLUS
INTRAVENOUS | Status: DC | PRN
  Administered 2022-09-19: 80 mg via INTRAVENOUS
  Administered 2022-09-19: 40 mg via INTRAVENOUS

## 2022-09-19 MED ORDER — ONDANSETRON HCL 4 MG/2ML IJ SOLN
INTRAMUSCULAR | Status: DC | PRN
  Administered 2022-09-19: 4 mg via INTRAVENOUS

## 2022-09-19 MED ORDER — ASPIRIN-DIPYRIDAMOLE ER 25-200 MG PO CP12: 1.0000 | ORAL_CAPSULE | Freq: Two times a day (BID) | ORAL | Status: AC

## 2022-09-19 MED ORDER — FENTANYL CITRATE (PF) 100 MCG/2ML IJ SOLN
INTRAMUSCULAR | Status: AC
  Filled 2022-09-19: qty 2

## 2022-09-19 MED ORDER — PROPOFOL 10 MG/ML IV BOLUS
INTRAVENOUS | Status: AC
  Filled 2022-09-19: qty 20

## 2022-09-19 MED ORDER — FENTANYL CITRATE (PF) 100 MCG/2ML IJ SOLN
INTRAMUSCULAR | Status: DC | PRN
  Administered 2022-09-19: 25 ug via INTRAVENOUS

## 2022-09-19 MED ORDER — SODIUM CHLORIDE 0.9 % IV SOLN
INTRAVENOUS | Status: DC
  Administered 2022-09-19: 20 mL/h via INTRAVENOUS

## 2022-09-19 MED ORDER — SUCCINYLCHOLINE CHLORIDE 200 MG/10ML IV SOSY
PREFILLED_SYRINGE | INTRAVENOUS | Status: DC | PRN
  Administered 2022-09-19: 100 mg via INTRAVENOUS

## 2022-09-19 MED ORDER — OMEPRAZOLE 20 MG PO CPDR: 20.0000 mg | DELAYED_RELEASE_CAPSULE | Freq: Every day | ORAL | 3 refills | Status: AC

## 2022-09-19 NOTE — Anesthesia Procedure Notes (Signed)
Procedure Name: Intubation Date/Time: 09/19/2022 6:45 AM  Performed by: Esaw Grandchild, CRNAPre-anesthesia Checklist: Patient identified, Emergency Drugs available, Suction available and Patient being monitored Patient Re-evaluated:Patient Re-evaluated prior to induction Oxygen Delivery Method: Circle system utilized Preoxygenation: Pre-oxygenation with 100% oxygen Induction Type: IV induction, Rapid sequence and Cricoid Pressure applied Ventilation: Mask ventilation without difficulty Laryngoscope Size: McGraph and 4 Grade View: Grade I Tube type: Oral Tube size: 7.0 mm Number of attempts: 1 Airway Equipment and Method: Stylet, Oral airway and Bite block Placement Confirmation: ETT inserted through vocal cords under direct vision, positive ETCO2 and breath sounds checked- equal and bilateral Secured at: 19 cm Tube secured with: Tape Dental Injury: Teeth and Oropharynx as per pre-operative assessment

## 2022-09-19 NOTE — Op Note (Signed)
[facility] Patient Name: Bianca Baker Procedure Date: 09/19/2022 6:29 AM MRN: [id number] Account #: [id number] Date of Birth: 11-18-1935 Admit Type: Outpatient Age: 86 Room: [facility] ENDO ROOM 4 Gender: Female Note Status: Finalized Instrument Name: Upper Endoscope [id number] Procedure:             Upper GI endoscopy Indications:           Foreign body in the esophagus Providers:             Lin Landsman MD, MD Medicines:             General Anesthesia Complications:         No immediate complications. Estimated blood loss: None. Procedure:             Pre-Anesthesia Assessment:                        - Prior to the procedure, a History and Physical was                         performed, and patient medications and allergies were                         reviewed. The patient is competent. The risks and                         benefits of the procedure and the sedation options and                         risks were discussed with the patient. All questions                         were answered and informed consent was obtained.                         Patient identification and proposed procedure were                         verified by the physician, the nurse, the                         anesthesiologist, the anesthetist and the technician                         in the pre-procedure area in the procedure room in the                         endoscopy suite. Mental Status Examination: alert and                         oriented. Airway Examination: normal oropharyngeal                         airway and neck mobility. Respiratory Examination:                         clear to auscultation. CV Examination: normal.  Prophylactic Antibiotics: The patient does not require                         prophylactic antibiotics. Prior Anticoagulants: The                         patient has taken Aggrenox, last dose was 1 day prior                          to procedure. ASA Grade Assessment: III - A patient                         with severe systemic disease. After reviewing the                         risks and benefits, the patient was deemed in                         satisfactory condition to undergo the procedure. The                         anesthesia plan was to use general anesthesia.                         Immediately prior to administration of medications,                         the patient was re-assessed for adequacy to receive                         sedatives. The heart rate, respiratory rate, oxygen                         saturations, blood pressure, adequacy of pulmonary                         ventilation, and response to care were monitored                         throughout the procedure. The physical status of the                         patient was re-assessed after the procedure.                        After obtaining informed consent, the endoscope was                         passed under direct vision. Throughout the procedure,                         the patient's blood pressure, pulse, and oxygen                         saturations were monitored continuously. The                         Endosonoscope was introduced through the mouth, and  advanced to the second part of duodenum. The upper GI                         endoscopy was accomplished without difficulty. The                         patient tolerated the procedure well. Findings:      The gastroesophageal junction and examined esophagus were normal. There       was no food bolus, it has passed, lower esophagus and GE junction       appeared edomatous from food bolus. No evidence of stricture      The entire examined stomach was normal.      The cardia and gastric fundus were normal on retroflexion.      The examined duodenum was normal. Impression:            - Normal gastroesophageal junction and esophagus.                         - Normal stomach.                        - Normal examined duodenum.                        - No specimens collected. Recommendation:        - Discharge patient to home (with escort).                        - Chopped diet and mechanical soft diet indefinitely.                        - Continue present medications.                        - Follow an antireflux regimen indefinitely.                        - Use Prilosec (omeprazole) 20 mg PO daily                         indefinitely. Procedure Code(s):     --- Professional ---                        [phone], Esophagogastroduodenoscopy, flexible,                         transoral; diagnostic, including collection of                         specimen(s) by brushing or washing, when performed                         (separate procedure) Diagnosis Code(s):     --- Professional ---                        T18.108A, Unspecified foreign body in esophagus                         causing other injury, initial encounter CPT copyright 2022  American Medical Association. All rights reserved. The codes documented in this report are preliminary and upon coder review may  be revised to meet current compliance requirements. Dr. Ulyess Mort Lin Landsman MD, MD 09/19/2022 6:54:13 AM This report has been signed electronically. Number of Addenda: 0 Note Initiated On: 09/19/2022 6:29 AM Estimated Blood Loss:  Estimated blood loss: none.      [facility]

## 2022-09-19 NOTE — ED Notes (Signed)
Patient denies pain and is resting comfortably.  
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
## 2022-09-19 NOTE — Transfer of Care (Signed)
Immediate Anesthesia Transfer of Care Note  Patient: Bianca Baker  Procedure(s) Performed: ESOPHAGOGASTRODUODENOSCOPY (EGD) WITH PROPOFOL  Patient Location: Endoscopy Unit  Anesthesia Type:General  Level of Consciousness: awake, alert , and oriented  Airway & Oxygen Therapy: Patient Spontanous Breathing and Patient connected to nasal cannula oxygen  Post-op Assessment: Report given to RN, Post -op Vital signs reviewed and stable, and Patient moving all extremities  Post vital signs: Reviewed and stable  Last Vitals:  Vitals Value Taken Time  BP    Temp    Pulse    Resp    SpO2      Last Pain:  Vitals:   09/19/22 0557  TempSrc: Temporal  PainSc:          Complications: No notable events documented.
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
## 2022-09-20 ENCOUNTER — Encounter: Payer: Self-pay | Admitting: Gastroenterology

## 2022-09-20 NOTE — Anesthesia Postprocedure Evaluation (Signed)
Anesthesia Post Note  Patient: Bianca Baker  Procedure(s) Performed: ESOPHAGOGASTRODUODENOSCOPY (EGD) WITH PROPOFOL  Anesthesia Type: General Anesthetic complications: no   No notable events documented.   Last Vitals:  Vitals:   09/19/22 0714 09/19/22 0724  BP: 139/65 (!) 133/50  Pulse: 65 62  Resp: 15 13  Temp:    SpO2: 91% 91%    Last Pain:  Vitals:   09/20/22 0733  TempSrc:   PainSc: 0-No pain                 Molli Barrows

## 2022-12-23 ENCOUNTER — Other Ambulatory Visit: Payer: Self-pay | Admitting: Gerontology

## 2022-12-23 DIAGNOSIS — Z1231 Encounter for screening mammogram for malignant neoplasm of breast: Secondary | ICD-10-CM

## 2022-12-29 ENCOUNTER — Ambulatory Visit
Admission: RE | Admit: 2022-12-29 | Discharge: 2022-12-29 | Disposition: A | Discharge status: Home / Self Care | Payer: Medicare Other | Source: Ambulatory Visit | Attending: Gerontology | Admitting: Gerontology

## 2022-12-29 DIAGNOSIS — Z1231 Encounter for screening mammogram for malignant neoplasm of breast: Secondary | ICD-10-CM | POA: Insufficient documentation

## 2023-01-02 ENCOUNTER — Observation Stay
Admission: EM | Admit: 2023-01-02 | Discharge: 2023-01-03 | Disposition: A | Discharge status: Home / Self Care | Payer: Medicare Other | Attending: Internal Medicine | Admitting: Internal Medicine

## 2023-01-02 ENCOUNTER — Emergency Department: Payer: Medicare Other

## 2023-01-02 ENCOUNTER — Inpatient Hospital Stay: Payer: Medicare Other

## 2023-01-02 ENCOUNTER — Encounter: Payer: Self-pay | Admitting: Emergency Medicine

## 2023-01-02 ENCOUNTER — Other Ambulatory Visit: Payer: Self-pay

## 2023-01-02 DIAGNOSIS — C50912 Malignant neoplasm of unspecified site of left female breast: Secondary | ICD-10-CM | POA: Diagnosis present

## 2023-01-02 DIAGNOSIS — Z6830 Body mass index (BMI) 30.0-30.9, adult: Secondary | ICD-10-CM | POA: Diagnosis not present

## 2023-01-02 DIAGNOSIS — Z96652 Presence of left artificial knee joint: Secondary | ICD-10-CM | POA: Insufficient documentation

## 2023-01-02 DIAGNOSIS — D72829 Elevated white blood cell count, unspecified: Secondary | ICD-10-CM | POA: Diagnosis not present

## 2023-01-02 DIAGNOSIS — Z87891 Personal history of nicotine dependence: Secondary | ICD-10-CM | POA: Diagnosis not present

## 2023-01-02 DIAGNOSIS — D509 Iron deficiency anemia, unspecified: Principal | ICD-10-CM | POA: Insufficient documentation

## 2023-01-02 DIAGNOSIS — Z853 Personal history of malignant neoplasm of breast: Secondary | ICD-10-CM | POA: Diagnosis not present

## 2023-01-02 DIAGNOSIS — Z79899 Other long term (current) drug therapy: Secondary | ICD-10-CM | POA: Diagnosis not present

## 2023-01-02 DIAGNOSIS — E669 Obesity, unspecified: Secondary | ICD-10-CM | POA: Diagnosis present

## 2023-01-02 DIAGNOSIS — D649 Anemia, unspecified: Principal | ICD-10-CM

## 2023-01-02 DIAGNOSIS — R531 Weakness: Secondary | ICD-10-CM

## 2023-01-02 DIAGNOSIS — I110 Hypertensive heart disease with heart failure: Secondary | ICD-10-CM | POA: Insufficient documentation

## 2023-01-02 DIAGNOSIS — Z8673 Personal history of transient ischemic attack (TIA), and cerebral infarction without residual deficits: Secondary | ICD-10-CM | POA: Insufficient documentation

## 2023-01-02 DIAGNOSIS — I5032 Chronic diastolic (congestive) heart failure: Secondary | ICD-10-CM | POA: Diagnosis not present

## 2023-01-02 DIAGNOSIS — Z96651 Presence of right artificial knee joint: Secondary | ICD-10-CM | POA: Diagnosis not present

## 2023-01-02 DIAGNOSIS — I10 Essential (primary) hypertension: Secondary | ICD-10-CM | POA: Diagnosis present

## 2023-01-02 DIAGNOSIS — E6609 Other obesity due to excess calories: Secondary | ICD-10-CM | POA: Insufficient documentation

## 2023-01-02 DIAGNOSIS — G459 Transient cerebral ischemic attack, unspecified: Secondary | ICD-10-CM | POA: Diagnosis present

## 2023-01-02 DIAGNOSIS — H9191 Unspecified hearing loss, right ear: Secondary | ICD-10-CM

## 2023-01-02 DIAGNOSIS — M170 Bilateral primary osteoarthritis of knee: Secondary | ICD-10-CM | POA: Diagnosis present

## 2023-01-02 DIAGNOSIS — H9121 Sudden idiopathic hearing loss, right ear: Secondary | ICD-10-CM | POA: Insufficient documentation

## 2023-01-02 LAB — URINALYSIS, ROUTINE W REFLEX MICROSCOPIC
Bilirubin Urine: NEGATIVE
Glucose, UA: NEGATIVE mg/dL
Hgb urine dipstick: NEGATIVE
Ketones, ur: NEGATIVE mg/dL
Leukocytes,Ua: NEGATIVE
Nitrite: NEGATIVE
Protein, ur: NEGATIVE mg/dL
Specific Gravity, Urine: 1.003 — ABNORMAL LOW (ref 1.005–1.030)
pH: 7 (ref 5.0–8.0)

## 2023-01-02 LAB — FERRITIN: Ferritin: 5 ng/mL — ABNORMAL LOW (ref 11–307)

## 2023-01-02 LAB — FOLATE: Folate: 34 ng/mL (ref >5.9)

## 2023-01-02 LAB — CBC
HCT: 23.7 % — ABNORMAL LOW (ref 36.0–46.0)
Hemoglobin: 6.5 g/dL — ABNORMAL LOW (ref 12.0–15.0)
MCH: 18.8 pg — ABNORMAL LOW (ref 26.0–34.0)
MCHC: 27.4 g/dL — ABNORMAL LOW (ref 30.0–36.0)
MCV: 68.5 fL — ABNORMAL LOW (ref 80.0–100.0)
Platelets: 384 10*3/uL (ref 150–400)
RBC: 3.46 MIL/uL — ABNORMAL LOW (ref 3.87–5.11)
RDW: 18.6 % — ABNORMAL HIGH (ref 11.5–15.5)
WBC: 10.9 10*3/uL — ABNORMAL HIGH (ref 4.0–10.5)
nRBC: 0 % (ref 0.0–0.2)

## 2023-01-02 LAB — BASIC METABOLIC PANEL
Anion gap: 10 (ref 5–15)
BUN: 20 mg/dL (ref 8–23)
CO2: 22 mmol/L (ref 22–32)
Calcium: 9 mg/dL (ref 8.9–10.3)
Chloride: 103 mmol/L (ref 98–111)
Creatinine, Ser: 1.03 mg/dL — ABNORMAL HIGH (ref 0.44–1.00)
GFR, Estimated: 54 mL/min — ABNORMAL LOW (ref >60)
Glucose, Bld: 115 mg/dL — ABNORMAL HIGH (ref 70–99)
Potassium: 4 mmol/L (ref 3.5–5.1)
Sodium: 135 mmol/L (ref 135–145)

## 2023-01-02 LAB — COMPREHENSIVE METABOLIC PANEL
ALT: 10 U/L (ref 0–44)
AST: 21 U/L (ref 15–41)
Albumin: 3.7 g/dL (ref 3.5–5.0)
Alkaline Phosphatase: 95 U/L (ref 38–126)
Anion gap: 11 (ref 5–15)
BUN: 20 mg/dL (ref 8–23)
CO2: 21 mmol/L — ABNORMAL LOW (ref 22–32)
Calcium: 9.3 mg/dL (ref 8.9–10.3)
Chloride: 106 mmol/L (ref 98–111)
Creatinine, Ser: 0.99 mg/dL (ref 0.44–1.00)
GFR, Estimated: 56 mL/min — ABNORMAL LOW (ref >60)
Glucose, Bld: 92 mg/dL (ref 70–99)
Potassium: 4 mmol/L (ref 3.5–5.1)
Sodium: 138 mmol/L (ref 135–145)
Total Bilirubin: 0.7 mg/dL (ref 0.3–1.2)
Total Protein: 7.7 g/dL (ref 6.5–8.1)

## 2023-01-02 LAB — VITAMIN B12: Vitamin B-12: 321 pg/mL (ref 180–914)

## 2023-01-02 LAB — RETICULOCYTES
Immature Retic Fract: 28.1 % — ABNORMAL HIGH (ref 2.3–15.9)
RBC.: 3.17 MIL/uL — ABNORMAL LOW (ref 3.87–5.11)
Retic Count, Absolute: 44.1 10*3/uL (ref 19.0–186.0)
Retic Ct Pct: 1.4 % (ref 0.4–3.1)

## 2023-01-02 LAB — IRON AND TIBC
Iron: 16 ug/dL — ABNORMAL LOW (ref 28–170)
Saturation Ratios: 3 % — ABNORMAL LOW (ref 10.4–31.8)
TIBC: 466 ug/dL — ABNORMAL HIGH (ref 250–450)
UIBC: 450 ug/dL

## 2023-01-02 LAB — TYPE AND SCREEN

## 2023-01-02 LAB — ABO/RH: ABO/RH(D): O POS

## 2023-01-02 LAB — HEMOGLOBIN AND HEMATOCRIT, BLOOD
HCT: 21.6 % — ABNORMAL LOW (ref 36.0–46.0)
Hemoglobin: 6 g/dL — ABNORMAL LOW (ref 12.0–15.0)

## 2023-01-02 LAB — PREPARE RBC (CROSSMATCH)

## 2023-01-02 MED ORDER — PANTOPRAZOLE SODIUM 40 MG PO TBEC: 40.0000 mg | DELAYED_RELEASE_TABLET | Freq: Every day | ORAL | Status: DC

## 2023-01-02 MED ORDER — ACETAMINOPHEN 650 MG RE SUPP: 650.0000 mg | Freq: Four times a day (QID) | RECTAL | Status: DC | PRN

## 2023-01-02 MED ORDER — ORAL CARE MOUTH RINSE: 15.0000 mL | OROMUCOSAL | Status: DC | PRN

## 2023-01-02 MED ORDER — IOHEXOL 350 MG/ML SOLN
100.0000 mL | Freq: Once | INTRAVENOUS | Status: AC | PRN
  Administered 2023-01-02: 100 mL via INTRAVENOUS

## 2023-01-02 MED ORDER — ONDANSETRON HCL 4 MG/2ML IJ SOLN: 4.0000 mg | Freq: Four times a day (QID) | INTRAMUSCULAR | Status: DC | PRN

## 2023-01-02 MED ORDER — PRAVASTATIN SODIUM 20 MG PO TABS
20.0000 mg | ORAL_TABLET | Freq: Every evening | ORAL | Status: DC
  Administered 2023-01-02: 20 mg via ORAL
  Filled 2023-01-02: qty 1

## 2023-01-02 MED ORDER — METOPROLOL SUCCINATE ER 50 MG PO TB24
50.0000 mg | ORAL_TABLET | Freq: Every day | ORAL | Status: DC
  Administered 2023-01-03: 50 mg via ORAL
  Filled 2023-01-02: qty 1

## 2023-01-02 MED ORDER — SENNOSIDES-DOCUSATE SODIUM 8.6-50 MG PO TABS: 1.0000 | ORAL_TABLET | Freq: Every evening | ORAL | Status: DC | PRN

## 2023-01-02 MED ORDER — ONDANSETRON HCL 4 MG PO TABS: 4.0000 mg | ORAL_TABLET | Freq: Four times a day (QID) | ORAL | Status: DC | PRN

## 2023-01-02 MED ORDER — SODIUM CHLORIDE 0.9 % IV SOLN
150.0000 mg | Freq: Once | INTRAVENOUS | Status: AC
  Administered 2023-01-02: 150 mg via INTRAVENOUS
  Filled 2023-01-02: qty 7.5

## 2023-01-02 MED ORDER — PANTOPRAZOLE SODIUM 40 MG IV SOLR
40.0000 mg | Freq: Once | INTRAVENOUS | Status: AC
  Administered 2023-01-02: 40 mg via INTRAVENOUS
  Filled 2023-01-02: qty 10

## 2023-01-02 MED ORDER — IRBESARTAN 150 MG PO TABS
300.0000 mg | ORAL_TABLET | Freq: Every day | ORAL | Status: DC
  Administered 2023-01-03: 300 mg via ORAL
  Filled 2023-01-02: qty 2

## 2023-01-02 MED ORDER — SODIUM CHLORIDE 0.9 % IV BOLUS
1000.0000 mL | Freq: Once | INTRAVENOUS | Status: AC
  Administered 2023-01-02: 1000 mL via INTRAVENOUS

## 2023-01-02 MED ORDER — PANTOPRAZOLE SODIUM 40 MG IV SOLR: 40.0000 mg | Freq: Every day | INTRAVENOUS | Status: DC

## 2023-01-02 MED ORDER — AMLODIPINE BESYLATE 5 MG PO TABS
5.0000 mg | ORAL_TABLET | Freq: Every day | ORAL | Status: DC
  Administered 2023-01-03: 5 mg via ORAL
  Filled 2023-01-02: qty 1

## 2023-01-02 MED ORDER — ACETAMINOPHEN 325 MG PO TABS: 650.0000 mg | ORAL_TABLET | Freq: Four times a day (QID) | ORAL | Status: DC | PRN

## 2023-01-02 MED ORDER — SODIUM CHLORIDE 0.9 % IV SOLN
10.0000 mL/h | Freq: Once | INTRAVENOUS | Status: AC
  Administered 2023-01-02: 10 mL/h via INTRAVENOUS

## 2023-01-02 MED ORDER — ALBUTEROL SULFATE (2.5 MG/3ML) 0.083% IN NEBU: 3.0000 mL | INHALATION_SOLUTION | RESPIRATORY_TRACT | Status: DC | PRN

## 2023-01-02 NOTE — Assessment & Plan Note (Signed)
-   Resumed home amlodipine 5 mg daily, irbesartan 200 mg daily, metoprolol succinate 50 mg daily resumed
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
## 2023-01-02 NOTE — ED Triage Notes (Signed)
Pt presents to the ED via POV due to increasing weakness "couple days". Pt denies NVD. Pt denies CP. Pt states she has SOB with increase exertion. Pt A&Ox4

## 2023-01-02 NOTE — ED Provider Notes (Signed)
[facility] Provider Note  Patient Contact: 1:25 PM (approximate)   History   Weakness   HPI  Bianca Baker is a 87 y.o. female who presents the emergency department for increasing and now profound generalized weakness.  Patient states over the last weeks she has had increasing generalized weakness.  Patient initially thought she was just tired.  It has been progressing to the point the patient feels tired just walking around her house, states that she becomes fatigued trying to brush her hair.  She states that she will have some exertional shortness of breath she feels that this is more weakness than true shortness of breath.  There is no cough, she shortness of breath at rest.  No history of CHF no peripheral edema.  Patient denies any headache, visual changes, chest pain.  No URI symptoms such as nasal congestions, sore throat or cough.  No fevers or chills.  Patient denies any urinary changes such as dysuria, polyuria or hematuria.  Patient does not state that she has had some intermittent GI bleeding over the last several months.  She sees some blood occasionally in the toilet bowl but has noticed that she has dark tarry like stools.  No abdominal pain.     Physical Exam   Triage Vital Signs: ED Triage Vitals  Enc Vitals Group     BP 01/02/23 1147 139/74     Pulse Rate 01/02/23 1147 82     Resp 01/02/23 1147 18     Temp 01/02/23 1147 97.7 F (36.5 C)     Temp Source 01/02/23 1147 Oral     SpO2 01/02/23 1147 98 %     Weight 01/02/23 1148 175 lb 0.7 oz (79.4 kg)     Height 01/02/23 1148 5' 2"$  (1.575 m)     Head Circumference --      Peak Flow --      Pain Score 01/02/23 1148 0     Pain Loc --      Pain Edu? --      Excl. in [place]? --     Most recent vital signs: Vitals:   01/02/23 1553 01/02/23 1708  BP: 130/70 (!) 166/56  Pulse: 80 70  Resp: 18 18  Temp: 98 F (36.7 C) 98.4 F (36.9 C)  SpO2: 98% 98%     General: Alert and in no acute  distress. Head: No acute traumatic findings  Neck: No stridor. No cervical spine tenderness to palpation.  Cardiovascular:  Good peripheral perfusion Respiratory: Normal respiratory effort without tachypnea or retractions. Lungs CTAB. Good air entry to the bases with no decreased or absent breath sounds Gastrointestinal: Bowel sounds 4 quadrants. Soft and nontender to palpation. No guarding or rigidity. No palpable masses. No distention. No CVA tenderness.  Rectal exam with faint positive guaiac test.  No distinct visible blood Musculoskeletal: Full range of motion to all extremities.  Neurologic:  No gross focal neurologic deficits are appreciated.  Skin:   No rash noted Other:   ED Results / Procedures / Treatments   Labs (all labs ordered are listed, but only abnormal results are displayed) Labs Reviewed  BASIC METABOLIC PANEL - Abnormal; Notable for the following components:      Result Value   Glucose, Bld 115 (*)    Creatinine, Ser 1.03 (*)    GFR, Estimated 54 (*)    All other components within normal limits  CBC - Abnormal; Notable for the following components:  WBC 10.9 (*)    RBC 3.46 (*)    Hemoglobin 6.5 (*)    HCT 23.7 (*)    MCV 68.5 (*)    MCH 18.8 (*)    MCHC 27.4 (*)    RDW 18.6 (*)    All other components within normal limits  URINALYSIS, ROUTINE W REFLEX MICROSCOPIC - Abnormal; Notable for the following components:   Color, Urine STRAW (*)    APPearance CLEAR (*)    Specific Gravity, Urine 1.003 (*)    All other components within normal limits  COMPREHENSIVE METABOLIC PANEL - Abnormal; Notable for the following components:   CO2 21 (*)    GFR, Estimated 56 (*)    All other components within normal limits  IRON AND TIBC - Abnormal; Notable for the following components:   Iron 16 (*)    TIBC 466 (*)    Saturation Ratios 3 (*)    All other components within normal limits  FERRITIN - Abnormal; Notable for the following components:   Ferritin 5 (*)     All other components within normal limits  RETICULOCYTES - Abnormal; Notable for the following components:   RBC. 3.17 (*)    Immature Retic Fract 28.1 (*)    All other components within normal limits  HEMOGLOBIN AND HEMATOCRIT, BLOOD - Abnormal; Notable for the following components:   Hemoglobin 6.0 (*)    HCT 21.6 (*)    All other components within normal limits  FOLATE  VITAMIN 123456  BASIC METABOLIC PANEL  CBC  HEMOGLOBIN AND HEMATOCRIT, BLOOD  CBG MONITORING, ED  TYPE AND SCREEN  PREPARE RBC (CROSSMATCH)  ABO/RH  TYPE AND SCREEN     EKG  ED ECG REPORT I, Charline Bills Xitlaly Ault,  personally viewed and interpreted this ECG.   Date: 01/02/2023  EKG Time: 1155 hrs.  Rate: 67 bpm  Rhythm: unchanged from previous tracings, normal sinus rhythm, occasional PVC noted, unifocal  Axis: Normal axis  Intervals:none  ST&T Change: No gross ST elevation or depression noted  Normal sinus rhythm with occasional PVC.  No STEMI.    RADIOLOGY  I personally viewed, evaluated, and interpreted these images as part of my medical decision making, as well as reviewing the written report by the radiologist.  ED Provider Interpretation: CT angio for GI bleed reveals no evidence of active gastrointestinal bleeding.  CT ANGIO GI BLEED  Result Date: 01/02/2023 CLINICAL DATA:  Weakness, fatigue, exertional dyspnea and significant anemia. EXAM: CTA ABDOMEN AND PELVIS WITHOUT AND WITH CONTRAST TECHNIQUE: Multidetector CT imaging of the abdomen and pelvis was performed using the standard protocol during bolus administration of intravenous contrast. Multiplanar reconstructed images and MIPs were obtained and reviewed to evaluate the vascular anatomy. RADIATION DOSE REDUCTION: This exam was performed according to the departmental dose-optimization program which includes automated exposure control, adjustment of the mA and/or kV according to patient size and/or use of iterative reconstruction technique.  CONTRAST:  177m OMNIPAQUE IOHEXOL 350 MG/ML SOLN COMPARISON:  None Available. FINDINGS: VASCULAR Aorta: Atherosclerosis without evidence of aneurysm, dissection or stenosis. Celiac: Plaque at the origin causes approximately 50% stenosis. Normally patent branch vessels and branch vessel anatomy. SMA: Normally patent. Renals: Single renal arteries with atherosclerosis at the origin of the right renal artery causing less than 50% stenosis. IMA: Normally patent. Inflow: Normally patent bilateral iliac arteries. Proximal Outflow: Normally patent bilateral common femoral arteries and femoral bifurcations. Veins: Venous phase imaging demonstrates normal patency of venous structures in the abdomen and pelvis  without anatomic variants. Review of the MIP images confirms the above findings. NON-VASCULAR Lower chest: No acute abnormality.  Small hiatal hernia. Hepatobiliary: Normal appearance of liver. Nondistended gallbladder contains 2 separate calcified gallstones. No evidence of gallbladder inflammation or biliary ductal dilatation. Pancreas: Unremarkable. No pancreatic ductal dilatation or surrounding inflammatory changes. Spleen: Normal in size without focal abnormality. Adrenals/Urinary Tract: Adrenal glands are unremarkable. Kidneys are normal, without renal calculi, focal lesion, or hydronephrosis. Bladder is unremarkable. Stomach/Bowel: Bowel shows no evidence of obstruction, ileus, inflammation or lesion. The appendix is normal. No free intraperitoneal air. Arterial and venous phases of imaging demonstrate no contrast extravasation into the gastrointestinal tract. Density within the body of the stomach was present on the unenhanced phase before contrast administration and relates to ingested material. Lymphatic: No enlarged lymph nodes identified in the abdomen or pelvis. Reproductive: Uterus and bilateral adnexa are unremarkable. Other: No abdominal wall hernia or abnormality. No abdominopelvic ascites.  Musculoskeletal: No acute or significant osseous findings. IMPRESSION: 1. No acute findings in the abdomen or pelvis. 2. No evidence of active bleeding into the gastrointestinal tract. 3. Atherosclerosis of the abdominal aorta without evidence of aneurysm. 4. 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 5. Cholelithiasis without evidence of cholecystitis. 6. Small hiatal hernia. Aortic Atherosclerosis (ICD10-I70.0). Electronically Signed   By: Aletta Edouard M.D.   On: 01/02/2023 16:40   DG Chest 2 View  Result Date: 01/02/2023 CLINICAL DATA:  Weakness EXAM: CHEST - 2 VIEW COMPARISON:  08/11/2018 and older FINDINGS: No consolidation, pneumothorax or effusion. No edema. Normal cardiopericardial silhouette. Calcified aorta. Rounded densities in the right upper quadrant. Possible gallstones. IMPRESSION: No acute cardiopulmonary disease Electronically Signed   By: Jill Side M.D.   On: 01/02/2023 12:48    PROCEDURES:  Critical Care performed: No  Procedures   MEDICATIONS ORDERED IN ED: Medications  acetaminophen (TYLENOL) tablet 650 mg (has no administration in time range)    Or  acetaminophen (TYLENOL) suppository 650 mg (has no administration in time range)  ondansetron (ZOFRAN) tablet 4 mg (has no administration in time range)    Or  ondansetron (ZOFRAN) injection 4 mg (has no administration in time range)  senna-docusate (Senokot-S) tablet 1 tablet (has no administration in time range)  amLODipine (NORVASC) tablet 5 mg (has no administration in time range)  metoprolol succinate (TOPROL-XL) 24 hr tablet 50 mg (has no administration in time range)  pravastatin (PRAVACHOL) tablet 20 mg (20 mg Oral Given 01/02/23 1741)  irbesartan (AVAPRO) tablet 300 mg (has no administration in time range)  pantoprazole (PROTONIX) EC tablet 40 mg (has no administration in time range)  albuterol (PROVENTIL) (2.5 MG/3ML) 0.083% nebulizer solution 3 mL (has no administration in time range)  iron sucrose  (VENOFER) 150 mg in sodium chloride 0.9 % 150 mL IVPB (has no administration in time range)  0.9 %  sodium chloride infusion (0 mL/hr Intravenous Stopped 01/02/23 1538)  pantoprazole (PROTONIX) injection 40 mg (40 mg Intravenous Given 01/02/23 1646)  sodium chloride 0.9 % bolus 1,000 mL (1,000 mLs Intravenous New Bag/Given 01/02/23 1536)  iohexol (OMNIPAQUE) 350 MG/ML injection 100 mL (100 mLs Intravenous Contrast Given 01/02/23 1617)     IMPRESSION / MDM / ASSESSMENT AND PLAN / ED COURSE  I reviewed the triage vital signs and the nursing notes.  Differential diagnosis includes, but is not limited to, UTI, COVID, flu, RSV, anemia, pneumonia, dehydration   Patient's presentation is most consistent with acute presentation with potential threat to life or bodily function.   Patient's diagnosis is consistent with anemia, weakness.  Patient presents emergency department with generalized weakness.  States that this has been progressing slowly over the past 4 weeks but does not become to the point where she cannot walk, do daily activities, even brush her hair without feeling very fatigued.  She has exertional shortness of breath.  Overall physical exam revealed no significant abnormal findings.  Patient's labs revealed anemia to 6.5.  Most recent labs I can visualize was 13.5 roughly 7 months ago.  Patient has had some intermittent rectal bleeding and has had some reported dark tarry stools.  Positive guaiac testing in the emergency department.  At this time I have ordered blood for the patient.  Ordered CT scan for any true active GI bleeding.  CT was reassuring with no GI source.  At this time I have reached out to hospitalist for admission for symptomatic anemia requiring blood transfusion.  I suspect that this is GI but we have ordered anemia panel to ensure this is not from other sources.  At this time patient care was transferred to the hospitalist service.Marland Kitchen      FINAL CLINICAL IMPRESSION(S) / ED DIAGNOSES   Final diagnoses:  Anemia, unspecified type  Generalized weakness     Rx / DC Orders   ED Discharge Orders     None        Note:  This document was prepared using Dragon voice recognition software and may include unintentional dictation errors.   Brynda Peon 01/02/23 1919    Merlyn Lot, MD 01/03/23 2040

## 2023-01-02 NOTE — Assessment & Plan Note (Addendum)
Initially held Aggrenox, until bleeding etiology determined.  Once it was felt that patient's anemia was not from bleeding, Aggrenox resumed 2/20

## 2023-01-02 NOTE — Hospital Course (Addendum)
Ms. Bianca Baker is a 87 year old female with history of hypertension, obesity, and GERD, who presented to the emergency department on 2/19 for chief concerns of weakness.  In the emergency room, vital signs were stable however lab work noted a hemoglobin of 6.5 with an MCV of 68. (Hemoglobin was 12.8 2-1/2 years ago.)  Iron studies noted low iron and ferritin plus elevated TIBC, all consistent with iron deficiency anemia.  Patient related no nausea or vomiting or change in her bowel habits.  Patient transfused 2 units packed red blood cells as well as iron infusion x 1 and repeat hemoglobin on morning of 2/20 at 8.9.

## 2023-01-02 NOTE — Assessment & Plan Note (Addendum)
-   I suspect this is reactive, low clinical suspicion for infectious etiology at this time

## 2023-01-02 NOTE — ED Notes (Signed)
See triage note Presents with with fatigue States she feels weak after trying to do min things at home  Denies any fever or pain

## 2023-01-02 NOTE — Assessment & Plan Note (Signed)
No evidence of blood loss given stability of vital signs, low MCV and iron studies.  Status post 2 units packed red blood cells plus iron infusion.  Hemoglobin better this morning.  Will also start patient on oral iron supplement.    States that she is not sure when she had her last colonoscopy, but it was under 10 years ago.  Patient can discuss with her PCP if a repeat colonoscopy is warranted.

## 2023-01-03 DIAGNOSIS — I10 Essential (primary) hypertension: Secondary | ICD-10-CM

## 2023-01-03 DIAGNOSIS — D509 Iron deficiency anemia, unspecified: Principal | ICD-10-CM

## 2023-01-03 DIAGNOSIS — I5032 Chronic diastolic (congestive) heart failure: Secondary | ICD-10-CM | POA: Diagnosis present

## 2023-01-03 DIAGNOSIS — E669 Obesity, unspecified: Secondary | ICD-10-CM | POA: Diagnosis present

## 2023-01-03 LAB — BASIC METABOLIC PANEL
Anion gap: 8 (ref 5–15)
BUN: 17 mg/dL (ref 8–23)
CO2: 24 mmol/L (ref 22–32)
Calcium: 8.9 mg/dL (ref 8.9–10.3)
Chloride: 107 mmol/L (ref 98–111)
Creatinine, Ser: 0.84 mg/dL (ref 0.44–1.00)
GFR, Estimated: >60 mL/min (ref >60)
Glucose, Bld: 99 mg/dL (ref 70–99)
Potassium: 3.7 mmol/L (ref 3.5–5.1)
Sodium: 139 mmol/L (ref 135–145)

## 2023-01-03 LAB — CBC
HCT: 28.9 % — ABNORMAL LOW (ref 36.0–46.0)
Hemoglobin: 8.9 g/dL — ABNORMAL LOW (ref 12.0–15.0)
MCH: 22.3 pg — ABNORMAL LOW (ref 26.0–34.0)
MCHC: 30.8 g/dL (ref 30.0–36.0)
MCV: 72.3 fL — ABNORMAL LOW (ref 80.0–100.0)
Platelets: 298 10*3/uL (ref 150–400)
RBC: 4 MIL/uL (ref 3.87–5.11)
RDW: 19.9 % — ABNORMAL HIGH (ref 11.5–15.5)
WBC: 9.2 10*3/uL (ref 4.0–10.5)
nRBC: 0.2 % (ref 0.0–0.2)

## 2023-01-03 LAB — BRAIN NATRIURETIC PEPTIDE: B Natriuretic Peptide: 170.5 pg/mL — ABNORMAL HIGH (ref 0.0–100.0)

## 2023-01-03 LAB — GLUCOSE, CAPILLARY: Glucose-Capillary: 90 mg/dL (ref 70–99)

## 2023-01-03 MED ORDER — ASPIRIN-DIPYRIDAMOLE ER 25-200 MG PO CP12
1.0000 | ORAL_CAPSULE | Freq: Two times a day (BID) | ORAL | Status: DC
  Administered 2023-01-03: 1 via ORAL
  Filled 2023-01-03: qty 1

## 2023-01-03 MED ORDER — FUROSEMIDE 10 MG/ML IJ SOLN
20.0000 mg | Freq: Once | INTRAMUSCULAR | Status: AC
  Administered 2023-01-03: 20 mg via INTRAVENOUS
  Filled 2023-01-03: qty 4

## 2023-01-03 MED ORDER — IRON 325 (65 FE) MG PO TABS: 1.0000 | ORAL_TABLET | Freq: Every day | ORAL | 0 refills | Status: AC

## 2023-01-03 MED ORDER — PANTOPRAZOLE SODIUM 40 MG PO TBEC
40.0000 mg | DELAYED_RELEASE_TABLET | Freq: Every day | ORAL | Status: DC
  Administered 2023-01-03: 40 mg via ORAL
  Filled 2023-01-03 (×2): qty 1

## 2023-01-03 NOTE — Care Management CC44 (Signed)
Condition Code 44 Documentation Completed  Patient Details  Name: VERYL MCMUNN MRN: :[id number] Date of Birth: 1936-01-10   Condition Code 44 given:  Yes Patient signature on Condition Code 44 notice:  Yes Documentation of 2 MD's agreement:  Yes Code 44 added to claim:  Yes    Beverly Sessions, RN 01/03/2023, 1:25 PM

## 2023-01-03 NOTE — Assessment & Plan Note (Addendum)
Noted on echocardiogram in October 2021.  Patient with persistently elevated blood pressures despite being on her blood pressure medications during hospitalization.  BNP only minimally elevated.  Patient received 1 dose of Lasix prior to discharge.

## 2023-01-03 NOTE — Discharge Summary (Signed)
Physician Discharge Summary   Patient: Bianca Baker MRN: [id number] DOB: [birth year]  Admit date:     01/02/2023  Discharge date: 01/03/23  Discharge Physician: Annita Brod   PCP: Latanya Maudlin, NP   Recommendations at discharge:   New medication: Iron tablet p.o. daily Patient will follow-up with her PCP in the next few weeks patient states that she is not sure when she had her last colonoscopy, but it was under 10 years ago.  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 [facility] Course: Bianca Baker is a 87 year old female with history of hypertension, obesity, and GERD, who presented to the emergency department on 2/19 for chief concerns of weakness.  In the emergency room, vital signs were stable however lab work noted a hemoglobin of 6.5 with an MCV of 68. (Hemoglobin was 12.8 2-1/2 years ago.)  Iron studies noted low iron and ferritin plus elevated TIBC, all consistent with iron deficiency anemia.  Patient related no nausea or vomiting or change in her bowel habits.  Patient transfused 2 units packed red blood cells as well as iron infusion x 1 and repeat hemoglobin on morning of 2/20 at 8.9.  Assessment and Plan: * Iron deficiency anemia No evidence of blood loss given stability of vital signs, low MCV and iron studies.  Status post 2 units packed red blood cells plus iron infusion.  Hemoglobin better this morning.  Will also start patient on oral iron supplement.    States that she is not sure when she had her last colonoscopy, but it was under 10 years ago.  Patient can discuss with  her PCP if a repeat colonoscopy is warranted.  Chronic diastolic CHF (congestive heart failure) ([place]) Noted on echocardiogram in October 2021.  Patient with persistently elevated blood pressures despite being on her blood pressure medications during hospitalization.  BNP only minimally elevated.  Patient received 1 dose of Lasix prior to discharge.  Benign essential hypertension - Resumed home amlodipine 5 mg daily, irbesartan 200 mg daily, metoprolol succinate 50 mg daily resumed  History of TIA (transient ischemic attack) Initially held Aggrenox, until bleeding etiology determined.  Once it was felt that patient's anemia was not from bleeding, Aggrenox resumed 2/20  Obesity (BMI 30-39.9) Meets criteria with BMI greater than 30         Consultants: None Procedures performed: Status post 2 units packed red blood cell transfusion as well as iron infusion  Disposition: Home Diet recommendation:  Discharge Diet Orders (From admission, onward)     Start     Ordered   01/03/23 0000  Diet - low sodium heart healthy        01/03/23 1157           Cardiac diet DISCHARGE MEDICATION: Allergies as of 01/03/2023       Reactions   Ace Inhibitors Cough   Reports cough was a result of bronchitis, not the medication    Atorvastatin Other (See Comments)   Other Reaction(s): Muscle Pain Muscle weakness   Hydrochlorothiazide Other (See Comments)   hyponatremia  Medication List     TAKE these medications    acetaminophen 500 MG tablet Commonly known as: TYLENOL Take 500-1,000 mg by mouth every 6 (six) hours as needed for mild pain or moderate pain.   albuterol 108 (90 Base) MCG/ACT inhaler Commonly known as: VENTOLIN HFA Inhale 1-2 puffs into the lungs every 4 (four) hours as needed for shortness of breath or wheezing.   amLODipine 5 MG tablet Commonly known as: NORVASC Take 5 mg by mouth daily.   Calcium Carb-Cholecalciferol 500-10 MG-MCG Tabs Take 1 tablet by  mouth daily.   dipyridamole-aspirin 200-25 MG 12hr capsule Commonly known as: Aggrenox Take 1 capsule by mouth 2 (two) times daily.   Iron 325 (65 Fe) MG Tabs Take 1 tablet (325 mg total) by mouth daily.   metoprolol succinate 50 MG 24 hr tablet Commonly known as: TOPROL-XL Take 50 mg by mouth daily.   Moderna COVID-19 Bival Booster 50 MCG/0.5ML injection Generic drug: COVID-19 mRNA bivalent vaccine (Moderna) Inject into the muscle.   omeprazole 20 MG capsule Commonly known as: PRILOSEC Take 1 capsule (20 mg total) by mouth daily before breakfast.   ONE-A-DAY WOMENS 50+ ADVANTAGE PO Take 1 tablet by mouth daily.   pravastatin 20 MG tablet Commonly known as: PRAVACHOL Take 20 mg by mouth every evening.   senna-docusate 8.6-50 MG tablet Commonly known as: Senokot-S Take 1 tablet by mouth daily as needed for moderate constipation or mild constipation.   telmisartan 80 MG tablet Commonly known as: MICARDIS Take 80 mg by mouth daily.   traMADol 50 MG tablet Commonly known as: Ultram Take 1 tablet (50 mg total) by mouth every 6 (six) hours as needed for moderate pain.   Vitamin D3 25 MCG (1000 UT) capsule Generic drug: Cholecalciferol Take 1,000 Units by mouth daily.        Discharge Exam: Filed Weights   01/02/23 1148  Weight: 79.4 kg   General: Alert and oriented x 3, no acute distress Cardiovascular: Regular rate and rhythm, S1-S2  Condition at discharge: good  The results of significant diagnostics from this hospitalization (including imaging, microbiology, ancillary and laboratory) are listed below for reference.   Imaging Studies: CT ANGIO GI BLEED  Result Date: 01/02/2023 CLINICAL DATA:  Weakness, fatigue, exertional dyspnea and significant anemia. EXAM: CTA ABDOMEN AND PELVIS WITHOUT AND WITH CONTRAST TECHNIQUE: Multidetector CT imaging of the abdomen and pelvis was performed using the standard protocol during bolus administration of intravenous  contrast. Multiplanar reconstructed images and MIPs were obtained and reviewed to evaluate the vascular anatomy. RADIATION DOSE REDUCTION: This exam was performed according to the departmental dose-optimization program which includes automated exposure control, adjustment of the mA and/or kV according to patient size and/or use of iterative reconstruction technique. CONTRAST:  141m OMNIPAQUE IOHEXOL 350 MG/ML SOLN COMPARISON:  None Available. FINDINGS: VASCULAR Aorta: Atherosclerosis without evidence of aneurysm, dissection or stenosis. Celiac: Plaque at the origin causes approximately 50% stenosis. Normally patent branch vessels and branch vessel anatomy. SMA: Normally patent. Renals: Single renal arteries with atherosclerosis at the origin of the right renal artery causing less than 50% stenosis. IMA: Normally patent. Inflow: Normally patent bilateral iliac arteries. Proximal Outflow: Normally patent bilateral common femoral arteries and femoral bifurcations. Veins: Venous phase imaging demonstrates normal patency of venous structures in the abdomen and pelvis without anatomic variants. Review of the MIP images confirms the above findings. NON-VASCULAR Lower chest: No acute abnormality.  Small hiatal hernia. Hepatobiliary: Normal appearance of liver. Nondistended gallbladder contains 2  separate calcified gallstones. No evidence of gallbladder inflammation or biliary ductal dilatation. Pancreas: Unremarkable. No pancreatic ductal dilatation or surrounding inflammatory changes. Spleen: Normal in size without focal abnormality. Adrenals/Urinary Tract: Adrenal glands are unremarkable. Kidneys are normal, without renal calculi, focal lesion, or hydronephrosis. Bladder is unremarkable. Stomach/Bowel: Bowel shows no evidence of obstruction, ileus, inflammation or lesion. The appendix is normal. No free intraperitoneal air. Arterial and venous phases of imaging demonstrate no contrast extravasation into the  gastrointestinal tract. Density within the body of the stomach was present on the unenhanced phase before contrast administration and relates to ingested material. Lymphatic: No enlarged lymph nodes identified in the abdomen or pelvis. Reproductive: Uterus and bilateral adnexa are unremarkable. Other: No abdominal wall hernia or abnormality. No abdominopelvic ascites. Musculoskeletal: No acute or significant osseous findings. IMPRESSION: 1. No acute findings in the abdomen or pelvis. 2. No evidence of active bleeding into the gastrointestinal tract. 3. Atherosclerosis of the abdominal aorta without evidence of aneurysm. 4. 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 5. Cholelithiasis without evidence of cholecystitis. 6. Small hiatal hernia. Aortic Atherosclerosis (ICD10-I70.0). Electronically Signed   By: Aletta Edouard M.D.   On: 01/02/2023 16:40   DG Chest 2 View  Result Date: 01/02/2023 CLINICAL DATA:  Weakness EXAM: CHEST - 2 VIEW COMPARISON:  08/11/2018 and older FINDINGS: No consolidation, pneumothorax or effusion. No edema. Normal cardiopericardial silhouette. Calcified aorta. Rounded densities in the right upper quadrant. Possible gallstones. IMPRESSION: No acute cardiopulmonary disease Electronically Signed   By: Jill Side M.D.   On: 01/02/2023 12:48   MM 3D SCREEN BREAST BILATERAL  Result Date: 12/30/2022 CLINICAL DATA:  Screening. EXAM: DIGITAL SCREENING BILATERAL MAMMOGRAM WITH TOMOSYNTHESIS AND CAD TECHNIQUE: Bilateral screening digital craniocaudal and mediolateral oblique mammograms were obtained. Bilateral screening digital breast tomosynthesis was performed. The images were evaluated with computer-aided detection. COMPARISON:  Previous exam(s). ACR Breast Density Category b: There are scattered areas of fibroglandular density. FINDINGS: There are no findings suspicious for malignancy. IMPRESSION: No mammographic evidence of malignancy. A result letter of this screening mammogram  will be mailed directly to the patient. RECOMMENDATION: Screening mammogram in one year. (Code:[id number]-01Y) BI-RADS CATEGORY  1: Negative. Electronically Signed   By: Marin Olp M.D.   On: 12/30/2022 16:29    Microbiology: Results for orders placed or performed during the hospital encounter of 08/19/20  Respiratory Panel by RT PCR (Flu A&B, Covid) - Nasopharyngeal Swab     Status: None   Collection Time: 08/19/20 12:48 AM   Specimen: Nasopharyngeal Swab  Result Value Ref Range Status   SARS Coronavirus 2 by RT PCR NEGATIVE NEGATIVE Final    Comment: (NOTE) SARS-CoV-2 target nucleic acids are NOT DETECTED.  The SARS-CoV-2 RNA is generally detectable in upper respiratoy specimens during the acute phase of infection. The lowest concentration of SARS-CoV-2 viral copies this assay can detect is 131 copies/mL. A negative result does not preclude SARS-Cov-2 infection and should not be used as the sole basis for treatment or other patient management decisions. A negative result may occur with  improper specimen collection/handling, submission of specimen other than nasopharyngeal swab, presence of viral mutation(s) within the areas targeted by this assay, and inadequate number of viral copies (<131 copies/mL). A negative result must be combined with clinical observations, patient history, and epidemiological information. The expected result is Negative.  Fact Sheet for Patients:  PinkCheek.be  Fact Sheet for Healthcare Providers:  GravelBags.it  This test is no t yet approved or cleared  by the [place] and  has been authorized for detection and/or diagnosis of SARS-CoV-2 by FDA under an Emergency Use Authorization (EUA). This EUA will remain  in effect (meaning this test can be used) for the duration of the COVID-19 declaration under Section 564(b)(1) of the Act, 21 U.S.C. section 360bbb-3(b)(1), unless the authorization is  terminated or revoked sooner.     Influenza A by PCR NEGATIVE NEGATIVE Final   Influenza B by PCR NEGATIVE NEGATIVE Final    Comment: (NOTE) The Xpert Xpress SARS-CoV-2/FLU/RSV assay is intended as an aid in  the diagnosis of influenza from Nasopharyngeal swab specimens and  should not be used as a sole basis for treatment. Nasal washings and  aspirates are unacceptable for Xpert Xpress SARS-CoV-2/FLU/RSV  testing.  Fact Sheet for Patients: PinkCheek.be  Fact Sheet for Healthcare Providers: GravelBags.it  This test is not yet approved or cleared by the [place] FDA and  has been authorized for detection and/or diagnosis of SARS-CoV-2 by  FDA under an Emergency Use Authorization (EUA). This EUA will remain  in effect (meaning this test can be used) for the duration of the  Covid-19 declaration under Section 564(b)(1) of the Act, 21  U.S.C. section 360bbb-3(b)(1), unless the authorization is  terminated or revoked. Performed at [facility], [address] [place], [place] [address]     Labs: CBC: Recent Labs  Lab 01/02/23 1209 01/02/23 1715 01/03/23 0445  WBC 10.9*  --  9.2  HGB 6.5* 6.0* 8.9*  HCT 23.7* 21.6* 28.9*  MCV 68.5*  --  72.3*  PLT 384  --  [id number]   Basic Metabolic Panel: Recent Labs  Lab 01/02/23 1209 01/02/23 1444 01/03/23 0445  NA 135 138 139  K 4.0 4.0 3.7  CL 103 106 107  CO2 22 21* 24  GLUCOSE 115* 92 99  BUN 20 20 17  $ CREATININE 1.03* 0.99 0.84  CALCIUM 9.0 9.3 8.9   Liver Function Tests: Recent Labs  Lab 01/02/23 1444  AST 21  ALT 10  ALKPHOS 95  BILITOT 0.7  PROT 7.7  ALBUMIN 3.7   CBG: Recent Labs  Lab 01/03/23 1231  GLUCAP 90    Discharge time spent: less than 30 minutes.  Signed: Annita Brod, MD [facility] 01/03/2023

## 2023-01-03 NOTE — TOC Initial Note (Signed)
Transition of Care [facility]) - Initial/Assessment Note    Patient Details  Name: Bianca Baker MRN: [id number] Date of Birth: 1935-12-25  Transition of [facility]) CM/SW Contact:    Beverly Sessions, RN Phone Number: 01/03/2023, 1:33 PM  Clinical Narrative:                   Transition of Care (TOC) Screening Note   Patient Details  Name: Bianca Baker Date of Birth: March 11, 1936   Transition of Care [facility]) CM/SW Contact:    Beverly Sessions, RN Phone Number: 01/03/2023, 1:33 PM    Transition of Care Department [facility]) has reviewed patient and no TOC needs have been identified at this time. We will continue to monitor patient advancement through interdisciplinary progression rounds. If new patient transition needs arise, please place a TOC consult.         Patient Goals and CMS Choice            Expected Discharge Plan and Services         Expected Discharge Date: 01/03/23                                    Prior Living Arrangements/Services                       Activities of Daily Living Home Assistive Devices/Equipment: Dentures (specify type), Eyeglasses ADL Screening (condition at time of admission) Patient's cognitive ability adequate to safely complete daily activities?: Yes Is the patient deaf or have difficulty hearing?: No Does the patient have difficulty seeing, even when wearing glasses/contacts?: No Does the patient have difficulty concentrating, remembering, or making decisions?: No Patient able to express need for assistance with ADLs?: Yes Does the patient have difficulty dressing or bathing?: No Independently performs ADLs?: Yes (appropriate for developmental age) Does the patient have difficulty walking or climbing stairs?: No Weakness of Legs: Both Weakness of Arms/Hands: None  Permission Sought/Granted                  Emotional Assessment              Admission diagnosis:  Generalized weakness  [R53.1] Symptomatic anemia [D64.9] Anemia, unspecified type [D64.9] Iron deficiency anemia [D50.9] Patient Active Problem List   Diagnosis Date Noted   Obesity (BMI 30-39.9) 01/03/2023   Chronic diastolic CHF (congestive heart failure) ([place]) 01/03/2023   Iron deficiency anemia 01/02/2023   Food impaction of esophagus 09/19/2022   History of TIA (transient ischemic attack) 08/19/2020   Esophageal obstruction due to food impaction    Borderline abnormal TFTs 09/18/2018   Acute hyponatremia 08/11/2018   Primary osteoarthritis of both knees 01/04/2018   Degenerative disc disease, cervical 07/13/2017   Adult BMI > 30 01/07/2016   High risk medication use 07/06/2015   Breast cancer, left breast ([place]) [id number]   [place] 123XX123   Need for Zostavax administration 10/22/2014   Benign essential hypertension 07/05/2014   Hypertension associated with type 2 diabetes mellitus ([place]) 07/05/2014   Facial nerve palsy, secondary 07/03/2014   Hearing loss in right ear 07/03/2014   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: [phone]: [phone] [id number] [place] [id number]: [phone]: [phone]   Social Determinants of Health (SDOH) Social History: SDOH Screenings   Food Insecurity: No Food Insecurity (01/02/2023)  Housing: Low Risk  (01/02/2023)  Transportation Needs: No Transportation Needs (01/02/2023)  Utilities: Not At Risk (01/02/2023)  Depression (PHQ2-9): Low Risk  (09/09/2019)  Tobacco Use: Medium Risk (01/02/2023)   SDOH  Interventions:     Readmission Risk Interventions     No data to display

## 2023-01-03 NOTE — Care Management Obs Status (Signed)
[place] NOTIFICATION   Patient Details  Name: Bianca Baker MRN: [id number] Date of Birth: September 18, 1936   Medicare Observation Status Notification Given:  Yes    Beverly Sessions, RN 01/03/2023, 1:25 PM

## 2023-01-03 NOTE — Assessment & Plan Note (Signed)
Meets criteria with BMI greater than 30 ?

## 2023-01-04 LAB — TYPE AND SCREEN
ABO/RH(D): O POS
Antibody Screen: NEGATIVE
Unit division: 0
Unit division: 0

## 2023-01-04 LAB — BPAM RBC
Blood Product Expiration Date: 202403202359
Blood Product Expiration Date: 202403202359
ISSUE DATE / TIME: 202402192026
ISSUE DATE / TIME: 202402200031
Unit Type and Rh: 5100
Unit Type and Rh: 5100

## 2024-01-02 ENCOUNTER — Other Ambulatory Visit: Payer: Self-pay | Admitting: Gastroenterology

## 2024-01-02 DIAGNOSIS — R1031 Right lower quadrant pain: Secondary | ICD-10-CM

## 2024-01-02 DIAGNOSIS — D509 Iron deficiency anemia, unspecified: Secondary | ICD-10-CM

## 2024-01-11 ENCOUNTER — Ambulatory Visit
Admission: RE | Admit: 2024-01-11 | Discharge: 2024-01-11 | Disposition: A | Discharge status: Home / Self Care | Payer: Medicare Other | Source: Ambulatory Visit | Attending: Gastroenterology | Admitting: Gastroenterology

## 2024-01-11 DIAGNOSIS — D509 Iron deficiency anemia, unspecified: Secondary | ICD-10-CM | POA: Insufficient documentation

## 2024-01-11 DIAGNOSIS — R1032 Left lower quadrant pain: Secondary | ICD-10-CM | POA: Insufficient documentation

## 2024-01-11 DIAGNOSIS — R1031 Right lower quadrant pain: Secondary | ICD-10-CM | POA: Insufficient documentation

## 2024-01-11 MED ORDER — IOHEXOL 300 MG/ML  SOLN
100.0000 mL | Freq: Once | INTRAMUSCULAR | Status: AC | PRN
  Administered 2024-01-11: 100 mL via INTRAVENOUS

## 2024-01-29 ENCOUNTER — Other Ambulatory Visit: Payer: Self-pay | Admitting: Gerontology

## 2024-01-29 DIAGNOSIS — Z1231 Encounter for screening mammogram for malignant neoplasm of breast: Secondary | ICD-10-CM

## 2024-02-05 ENCOUNTER — Ambulatory Visit
Admission: RE | Admit: 2024-02-05 | Discharge: 2024-02-05 | Disposition: A | Discharge status: Home / Self Care | Source: Ambulatory Visit | Attending: Gerontology | Admitting: Gerontology

## 2024-02-05 DIAGNOSIS — Z1231 Encounter for screening mammogram for malignant neoplasm of breast: Secondary | ICD-10-CM | POA: Diagnosis present

## 2024-04-10 ENCOUNTER — Emergency Department
Admission: EM | Admit: 2024-04-10 | Discharge: 2024-04-10 | Disposition: A | Discharge status: Home / Self Care | Attending: Emergency Medicine | Admitting: Emergency Medicine

## 2024-04-10 ENCOUNTER — Other Ambulatory Visit: Payer: Self-pay

## 2024-04-10 DIAGNOSIS — M79602 Pain in left arm: Secondary | ICD-10-CM | POA: Insufficient documentation

## 2024-04-10 LAB — TROPONIN I (HIGH SENSITIVITY): Troponin I (High Sensitivity): 7 ng/L (ref <18)

## 2024-04-10 LAB — BASIC METABOLIC PANEL WITH GFR
Anion gap: 11 (ref 5–15)
BUN: 15 mg/dL (ref 8–23)
CO2: 25 mmol/L (ref 22–32)
Calcium: 9.6 mg/dL (ref 8.9–10.3)
Chloride: 101 mmol/L (ref 98–111)
Creatinine, Ser: 0.85 mg/dL (ref 0.44–1.00)
GFR, Estimated: >60 mL/min (ref >60)
Glucose, Bld: 128 mg/dL — ABNORMAL HIGH (ref 70–99)
Potassium: 3.9 mmol/L (ref 3.5–5.1)
Sodium: 137 mmol/L (ref 135–145)

## 2024-04-10 LAB — CBC WITH DIFFERENTIAL/PLATELET
Abs Immature Granulocytes: 0.03 10*3/uL (ref 0.00–0.07)
Basophils Absolute: 0.1 10*3/uL (ref 0.0–0.1)
Basophils Relative: 1 %
Eosinophils Absolute: 0.1 10*3/uL (ref 0.0–0.5)
Eosinophils Relative: 1 %
HCT: 42.4 % (ref 36.0–46.0)
Hemoglobin: 14.2 g/dL (ref 12.0–15.0)
Immature Granulocytes: 0 %
Lymphocytes Relative: 25 %
Lymphs Abs: 2.2 10*3/uL (ref 0.7–4.0)
MCH: 30.6 pg (ref 26.0–34.0)
MCHC: 33.5 g/dL (ref 30.0–36.0)
MCV: 91.4 fL (ref 80.0–100.0)
Monocytes Absolute: 0.6 10*3/uL (ref 0.1–1.0)
Monocytes Relative: 8 %
Neutro Abs: 5.5 10*3/uL (ref 1.7–7.7)
Neutrophils Relative %: 65 %
Platelets: 288 10*3/uL (ref 150–400)
RBC: 4.64 MIL/uL (ref 3.87–5.11)
RDW: 12.9 % (ref 11.5–15.5)
WBC: 8.5 10*3/uL (ref 4.0–10.5)
nRBC: 0 % (ref 0.0–0.2)

## 2024-04-10 MED ORDER — OXYCODONE-ACETAMINOPHEN 5-325 MG PO TABS
1.0000 | ORAL_TABLET | Freq: Once | ORAL | Status: AC
  Administered 2024-04-10: 1 via ORAL
  Filled 2024-04-10: qty 1

## 2024-04-10 MED ORDER — KETOROLAC TROMETHAMINE 30 MG/ML IJ SOLN
15.0000 mg | Freq: Once | INTRAMUSCULAR | Status: AC
  Administered 2024-04-10: 15 mg via INTRAVENOUS
  Filled 2024-04-10: qty 1

## 2024-04-10 MED ORDER — HYDROCODONE-ACETAMINOPHEN 5-325 MG PO TABS: 1.0000 | ORAL_TABLET | Freq: Four times a day (QID) | ORAL | 0 refills | Status: AC | PRN

## 2024-04-10 NOTE — ED Provider Notes (Signed)
 [facility] Provider Note    Event Date/Time   First MD Initiated Contact with Patient 04/10/24 [phone]     (approximate)   History   Joint Pain   HPI Bianca Baker is a 88 y.o. female who presents by EMS for evaluation of pain in her left arm.  She said that it started up in her left shoulder earlier in the day (15 or 16 hours ago) and she thinks it is her arthritis.  However it is aching and traveling down the length of her arm and by 4 AM she decided that she could not take it anymore so she called EMS.  She is in no obvious distress.  She cannot reproduce the pain with any amount of movement or range of motion.  She has had no fall or other injury recently.  She has not been using her arm in any new or different way than usual.  She is right arm dominant and it is her left arm and shoulder that is bothering her.  She has some swelling around the left wrist but she said that is normal for her.  She has no numbness nor tingling.  She denies chest pain and shortness of breath.  No history of known cardiac disease.     Physical Exam   ED Triage Vitals  Encounter Vitals Group     BP 04/10/24 0430 (!) 173/68     Systolic BP Percentile --      Diastolic BP Percentile --      Pulse Rate 04/10/24 0430 62     Resp 04/10/24 0430 17     Temp 04/10/24 0430 98.2 F (36.8 C)     Temp Source 04/10/24 0430 Oral     SpO2 04/10/24 0430 98 %     Weight 04/10/24 0418 80.3 kg (177 lb)     Height 04/10/24 0418 1.549 m (5\' 1" )     Head Circumference --      Peak Flow --      Pain Score 04/10/24 0416 2     Pain Loc --      Pain Education --      Exclude from Growth Chart --      Most recent vital signs: Vitals:   04/10/24 0430 04/10/24 0500  BP: (!) 173/68 (!) 151/64  Pulse: 62 61  Resp: 17 12  Temp: 98.2 F (36.8 C)   SpO2: 98% 96%    General: Awake, no distress.  CV:  Good peripheral perfusion.  Resp:  Normal effort. Speaking easily and comfortably, no  accessory muscle usage nor intercostal retractions.   Abd:  No distention.  Other:  Normal passive range of motion of the patient's left shoulder, elbow, and wrist.  The patient reports some pain in her left shoulder, her left elbow, and left wrist, but it is not reproducible with passive or active range of motion.  No palpable deformities.  Easily palpable left radial pulse and good perfusion and warm extremity throughout.  No evidence of cellulitis, no other rash present.  Other than the previously mentioned edema around the left wrist which the patient says is chronic, I cannot appreciate any swelling throughout the extremity.  There is no ecchymosis.   ED Results / Procedures / Treatments   Labs (all labs ordered are listed, but only abnormal results are displayed) Labs Reviewed  BASIC METABOLIC PANEL WITH GFR - Abnormal; Notable for the following components:      Result  Value   Glucose, Bld 128 (*)    All other components within normal limits  CBC WITH DIFFERENTIAL/PLATELET  TROPONIN I (HIGH SENSITIVITY)     EKG  ED ECG REPORT I, Lynnda Sas, the attending physician, personally viewed and interpreted this ECG.  Date: 04/10/2024 EKG Time: 4:18 AM Rate: 61 Rhythm: normal sinus rhythm QRS Axis: LAD Intervals: normal ST/T Wave abnormalities: normal Narrative Interpretation: no evidence of acute ischemia    RADIOLOGY See ED course for details about any imaging studies ordered   PROCEDURES:  Critical Care performed: No  .1-3 Lead EKG Interpretation  Performed by: Lynnda Sas, MD Authorized by: Lynnda Sas, MD     Interpretation: normal     ECG rate:  60   ECG rate assessment: normal     Rhythm: sinus rhythm     Ectopy: none     Conduction: normal       IMPRESSION / MDM / ASSESSMENT AND PLAN / ED COURSE  I reviewed the triage vital signs and the nursing notes.                              Differential diagnosis includes, but is not limited to,  arthritis, nonspecific musculoskeletal strain, vascular occlusion, atypical ACS presentation.  Patient's presentation is most consistent with acute presentation with potential threat to life or bodily function.  Labs/studies ordered: EKG, BMP, high-sensitivity troponin, CBC with differential  Interventions/Medications given:  Medications  ketorolac (TORADOL) 30 MG/ML injection 15 mg (has no administration in time range)  oxyCODONE -acetaminophen  (PERCOCET/ROXICET) 5-325 MG per tablet 1 tablet (1 tablet Oral Given 04/10/24 0436)    (Note:  hospital course my include additional interventions and/or labs/studies not listed above.)   Patient is hemodynamically stable and has very minimal symptoms.  Symptoms have been going on for more than 12 hours.  I think it is unlikely this is an atypical ACS presentation but I will evaluate for the possibility with 1 high-sensitivity troponin.  EKG shows no sign of ischemia.  Basic lab work is pending and I ordered a Percocet to help with the pain.  I explained to the patient that it most likely is arthritis and there is likely little that I can do to fix it and she says she understands.  I explained that I think there is very limited utility to getting any x-rays because she has not had any traumatic injury and there are no concerning physical exam findings, and she agrees with that also.  She has a good perfusion and easily palpable radial pulse and I am not concerned about the possibility of thromboembolism which would require a Doppler ultrasound.  We will reassess after the lab work and see if the Percocet helps with the pain.  If her renal function is appropriate, I will order Toradol 15 mg IV as well.  The patient is on the cardiac monitor to evaluate for evidence of arrhythmia and/or significant heart rate changes.   Clinical Course as of 04/10/24 0527  Wed Apr 10, 2024  0526 Labs all within normal limits.  I reassessed the patient.  She said that her  arm feels better and the pain has "calmed down".  I also ordered a dose of Toradol 15 mg IV for continued help with the presumed arthritis pain.    The patient's medical screening exam is reassuring with no indication of an emergent medical condition requiring hospitalization or additional evaluation at this  point.  The patient is safe and appropriate for discharge and outpatient follow up.  To help with additional pain control at home, I prescribed a short course of Norco and encouraged her to not take them along with the tramadol , but rather just take one of the other, whichever one works.  I encouraged her to follow-up at the next available opportunity with her primary care provider. [CF]    Clinical Course User Index [CF] Lynnda Sas, MD     FINAL CLINICAL IMPRESSION(S) / ED DIAGNOSES   Final diagnoses:  Left arm pain     Rx / DC Orders   ED Discharge Orders          Ordered    HYDROcodone -acetaminophen  (NORCO/VICODIN) 5-325 MG tablet  Every 6 hours PRN        04/10/24 0526             Note:  This document was prepared using Dragon voice recognition software and may include unintentional dictation errors.   Lynnda Sas, MD 04/10/24 [phone]

## 2024-04-10 NOTE — Discharge Instructions (Signed)
 Your workup in the Emergency Department today was reassuring.  We did not find any specific abnormalities.  We recommend you drink plenty of fluids, take your regular medications and/or any new ones prescribed today, and follow up with the doctor(s) listed in these documents as recommended.  Return to the Emergency Department if you develop new or worsening symptoms that concern you.

## 2024-04-10 NOTE — ED Triage Notes (Signed)
 Pt arrives by EMS with c/o left arm pain and swelling since 12 noon yesterday. Pt denies chest pain and SOB. Pt has hx of arthritis.
